# Patient Record
Sex: Female | Born: 1935 | Race: White | Hispanic: No | Smoking: Former smoker
Health system: Southern US, Community
[De-identification: ages and names within clinical notes are randomized; demographics above are authoritative.]

## PROBLEM LIST (undated history)

## (undated) DIAGNOSIS — G4700 Insomnia, unspecified: Secondary | ICD-10-CM

## (undated) DIAGNOSIS — M1990 Unspecified osteoarthritis, unspecified site: Secondary | ICD-10-CM

## (undated) DIAGNOSIS — W19XXXA Unspecified fall, initial encounter: Secondary | ICD-10-CM

## (undated) DIAGNOSIS — E785 Hyperlipidemia, unspecified: Secondary | ICD-10-CM

## (undated) DIAGNOSIS — R42 Dizziness and giddiness: Secondary | ICD-10-CM

## (undated) DIAGNOSIS — Z8619 Personal history of other infectious and parasitic diseases: Secondary | ICD-10-CM

## (undated) DIAGNOSIS — I6529 Occlusion and stenosis of unspecified carotid artery: Secondary | ICD-10-CM

## (undated) DIAGNOSIS — K219 Gastro-esophageal reflux disease without esophagitis: Secondary | ICD-10-CM

## (undated) DIAGNOSIS — N189 Chronic kidney disease, unspecified: Secondary | ICD-10-CM

## (undated) DIAGNOSIS — H409 Unspecified glaucoma: Secondary | ICD-10-CM

## (undated) DIAGNOSIS — E119 Type 2 diabetes mellitus without complications: Secondary | ICD-10-CM

## (undated) DIAGNOSIS — M5030 Other cervical disc degeneration, unspecified cervical region: Secondary | ICD-10-CM

## (undated) DIAGNOSIS — M4800 Spinal stenosis, site unspecified: Secondary | ICD-10-CM

## (undated) DIAGNOSIS — J302 Other seasonal allergic rhinitis: Secondary | ICD-10-CM

## (undated) DIAGNOSIS — K589 Irritable bowel syndrome without diarrhea: Secondary | ICD-10-CM

## (undated) DIAGNOSIS — N8110 Cystocele, unspecified: Secondary | ICD-10-CM

## (undated) DIAGNOSIS — E221 Hyperprolactinemia: Secondary | ICD-10-CM

## (undated) DIAGNOSIS — M419 Scoliosis, unspecified: Secondary | ICD-10-CM

## (undated) DIAGNOSIS — I10 Essential (primary) hypertension: Secondary | ICD-10-CM

## (undated) DIAGNOSIS — J449 Chronic obstructive pulmonary disease, unspecified: Secondary | ICD-10-CM

## (undated) DIAGNOSIS — R296 Repeated falls: Secondary | ICD-10-CM

## (undated) DIAGNOSIS — I679 Cerebrovascular disease, unspecified: Secondary | ICD-10-CM

## (undated) DIAGNOSIS — M8580 Other specified disorders of bone density and structure, unspecified site: Secondary | ICD-10-CM

## (undated) DIAGNOSIS — D649 Anemia, unspecified: Secondary | ICD-10-CM

## (undated) DIAGNOSIS — I340 Nonrheumatic mitral (valve) insufficiency: Secondary | ICD-10-CM

## (undated) HISTORY — PX: SHOULDER ARTHROTOMY: SHX1050

## (undated) HISTORY — DX: Dizziness and giddiness: R42

## (undated) HISTORY — DX: Insomnia, unspecified: G47.00

## (undated) HISTORY — DX: Unspecified glaucoma: H40.9

## (undated) HISTORY — DX: Other cervical disc degeneration, unspecified cervical region: M50.30

## (undated) HISTORY — PX: DILATION AND CURETTAGE OF UTERUS: SHX78

## (undated) HISTORY — DX: Unspecified fall, initial encounter: W19.XXXA

## (undated) HISTORY — DX: Type 2 diabetes mellitus without complications: E11.9

## (undated) HISTORY — DX: Unspecified osteoarthritis, unspecified site: M19.90

## (undated) HISTORY — DX: Irritable bowel syndrome, unspecified: K58.9

## (undated) HISTORY — DX: Scoliosis, unspecified: M41.9

## (undated) HISTORY — DX: Spinal stenosis, site unspecified: M48.00

## (undated) HISTORY — DX: Occlusion and stenosis of unspecified carotid artery: I65.29

## (undated) HISTORY — DX: Hyperprolactinemia: E22.1

## (undated) HISTORY — PX: EYE SURGERY: SHX253

## (undated) HISTORY — DX: Cerebrovascular disease, unspecified: I67.9

## (undated) HISTORY — DX: Cystocele, unspecified: N81.10

## (undated) HISTORY — DX: Anemia, unspecified: D64.9

## (undated) HISTORY — DX: Nonrheumatic mitral (valve) insufficiency: I34.0

## (undated) HISTORY — DX: Other seasonal allergic rhinitis: J30.2

## (undated) HISTORY — DX: Hyperlipidemia, unspecified: E78.5

## (undated) HISTORY — DX: Personal history of other infectious and parasitic diseases: Z86.19

## (undated) HISTORY — DX: Repeated falls: R29.6

## (undated) HISTORY — PX: COLONOSCOPY W/ POLYPECTOMY: SHX1380

## (undated) HISTORY — DX: Other specified disorders of bone density and structure, unspecified site: M85.80

## (undated) HISTORY — DX: Chronic obstructive pulmonary disease, unspecified: J44.9

## (undated) HISTORY — DX: Essential (primary) hypertension: I10

## (undated) HISTORY — DX: Chronic kidney disease, unspecified: N18.9

---

## 1986-11-25 HISTORY — PX: BREAST SURGERY: SHX581

## 1991-11-26 HISTORY — PX: PITUITARY SURGERY: SHX203

## 1998-06-06 ENCOUNTER — Ambulatory Visit (HOSPITAL_COMMUNITY): Admission: RE | Admit: 1998-06-06 | Discharge: 1998-06-06 | Payer: Self-pay | Admitting: Internal Medicine

## 1998-11-22 ENCOUNTER — Other Ambulatory Visit: Admission: RE | Admit: 1998-11-22 | Discharge: 1998-11-22 | Payer: Self-pay | Admitting: Internal Medicine

## 1999-08-28 ENCOUNTER — Ambulatory Visit (HOSPITAL_COMMUNITY): Admission: RE | Admit: 1999-08-28 | Discharge: 1999-08-28 | Payer: Self-pay | Admitting: Endocrinology

## 1999-08-28 ENCOUNTER — Encounter: Payer: Self-pay | Admitting: Endocrinology

## 2001-08-31 ENCOUNTER — Encounter: Payer: Self-pay | Admitting: Internal Medicine

## 2001-08-31 ENCOUNTER — Encounter: Admission: RE | Admit: 2001-08-31 | Discharge: 2001-08-31 | Payer: Self-pay | Admitting: Internal Medicine

## 2002-04-14 ENCOUNTER — Encounter (INDEPENDENT_AMBULATORY_CARE_PROVIDER_SITE_OTHER): Payer: Self-pay | Admitting: Specialist

## 2002-04-14 ENCOUNTER — Ambulatory Visit (HOSPITAL_COMMUNITY): Admission: RE | Admit: 2002-04-14 | Discharge: 2002-04-14 | Payer: Self-pay | Admitting: Gastroenterology

## 2003-01-06 ENCOUNTER — Other Ambulatory Visit: Admission: RE | Admit: 2003-01-06 | Discharge: 2003-01-06 | Payer: Self-pay | Admitting: Internal Medicine

## 2003-12-28 ENCOUNTER — Encounter: Admission: RE | Admit: 2003-12-28 | Discharge: 2003-12-28 | Payer: Self-pay

## 2004-07-25 ENCOUNTER — Ambulatory Visit: Admission: RE | Admit: 2004-07-25 | Discharge: 2004-07-25 | Payer: Self-pay | Admitting: Internal Medicine

## 2004-10-03 ENCOUNTER — Ambulatory Visit: Payer: Self-pay | Admitting: Oncology

## 2006-04-07 ENCOUNTER — Ambulatory Visit: Payer: Self-pay | Admitting: Pulmonary Disease

## 2006-04-25 ENCOUNTER — Ambulatory Visit: Payer: Self-pay | Admitting: Pulmonary Disease

## 2006-06-03 ENCOUNTER — Inpatient Hospital Stay (HOSPITAL_COMMUNITY): Admission: EM | Admit: 2006-06-03 | Discharge: 2006-06-06 | Payer: Self-pay | Admitting: Emergency Medicine

## 2006-06-04 ENCOUNTER — Encounter: Payer: Self-pay | Admitting: Cardiology

## 2006-06-04 ENCOUNTER — Ambulatory Visit: Payer: Self-pay | Admitting: Oncology

## 2006-06-04 ENCOUNTER — Ambulatory Visit: Payer: Self-pay | Admitting: Cardiology

## 2006-06-06 ENCOUNTER — Encounter (INDEPENDENT_AMBULATORY_CARE_PROVIDER_SITE_OTHER): Payer: Self-pay | Admitting: *Deleted

## 2006-06-11 ENCOUNTER — Ambulatory Visit (HOSPITAL_COMMUNITY): Admission: RE | Admit: 2006-06-11 | Discharge: 2006-06-11 | Payer: Self-pay | Admitting: Gastroenterology

## 2006-08-26 ENCOUNTER — Ambulatory Visit: Payer: Self-pay | Admitting: Oncology

## 2006-10-23 ENCOUNTER — Ambulatory Visit: Payer: Self-pay | Admitting: Oncology

## 2006-10-28 LAB — CBC WITH DIFFERENTIAL/PLATELET
HGB: 11.3 g/dL — ABNORMAL LOW (ref 11.6–15.9)
MCH: 31.6 pg (ref 26.0–34.0)
MONO%: 7.7 % (ref 0.0–13.0)
NEUT#: 4.2 10*3/uL (ref 1.5–6.5)
NEUT%: 68.9 % (ref 39.6–76.8)
RBC: 3.56 10*6/uL — ABNORMAL LOW (ref 3.70–5.32)
lymph#: 1.3 10*3/uL (ref 0.9–3.3)

## 2006-10-28 LAB — IRON AND TIBC
Iron: 174 ug/dL — ABNORMAL HIGH (ref 42–145)
TIBC: 353 ug/dL (ref 250–470)
UIBC: 179 ug/dL

## 2006-10-28 LAB — RETICULOCYTES
RETIC #: 52.3 10*3/uL (ref 19.7–115.1)
Retic %: 1.5 % (ref 0.4–2.3)

## 2007-12-09 ENCOUNTER — Encounter (HOSPITAL_COMMUNITY): Admission: RE | Admit: 2007-12-09 | Discharge: 2007-12-10 | Payer: Self-pay | Admitting: Gastroenterology

## 2010-05-23 ENCOUNTER — Encounter: Admission: RE | Admit: 2010-05-23 | Discharge: 2010-05-23 | Payer: Self-pay | Admitting: Endocrinology

## 2010-06-26 ENCOUNTER — Encounter: Admission: RE | Admit: 2010-06-26 | Discharge: 2010-06-26 | Payer: Self-pay | Admitting: Internal Medicine

## 2010-07-25 ENCOUNTER — Encounter: Admission: RE | Admit: 2010-07-25 | Discharge: 2010-07-25 | Payer: Self-pay | Admitting: Internal Medicine

## 2010-12-15 ENCOUNTER — Other Ambulatory Visit: Payer: Self-pay | Admitting: Nephrology

## 2010-12-15 DIAGNOSIS — N281 Cyst of kidney, acquired: Secondary | ICD-10-CM

## 2010-12-15 DIAGNOSIS — Z09 Encounter for follow-up examination after completed treatment for conditions other than malignant neoplasm: Secondary | ICD-10-CM

## 2011-01-07 ENCOUNTER — Ambulatory Visit
Admission: RE | Admit: 2011-01-07 | Discharge: 2011-01-07 | Disposition: A | Discharge status: Home / Self Care | Payer: Medicare Other | Source: Ambulatory Visit | Attending: Nephrology | Admitting: Nephrology

## 2011-01-07 DIAGNOSIS — N281 Cyst of kidney, acquired: Secondary | ICD-10-CM

## 2011-01-07 DIAGNOSIS — Z09 Encounter for follow-up examination after completed treatment for conditions other than malignant neoplasm: Secondary | ICD-10-CM

## 2011-03-26 ENCOUNTER — Other Ambulatory Visit: Payer: Self-pay | Admitting: Internal Medicine

## 2011-03-26 ENCOUNTER — Ambulatory Visit
Admission: RE | Admit: 2011-03-26 | Discharge: 2011-03-26 | Disposition: A | Discharge status: Home / Self Care | Payer: Medicare Other | Source: Ambulatory Visit | Attending: Internal Medicine | Admitting: Internal Medicine

## 2011-03-26 DIAGNOSIS — R1031 Right lower quadrant pain: Secondary | ICD-10-CM

## 2011-04-12 NOTE — Discharge Summary (Signed)
Shelia Holt, Shelia Holt              ACCOUNT NO.:  [id number]   MEDICAL RECORD NO.:  [id number]          PATIENT TYPE:  INP   LOCATION:  2901                         FACILITY:  [facility]   PHYSICIAN:  Mobolaji B. Bakare, M.D.DATE OF BIRTH:  1922/06/01   DATE OF ADMISSION:  06/03/2006  DATE OF DISCHARGE:  06/06/2006                                 DISCHARGE SUMMARY   PRIMARY CARE PHYSICIAN:  Dr. Merri Brunette.   FINAL DIAGNOSES:  1.  Severe iron deficiency anemia.  2.  Positive hemoccult, likely gastrointestinal for #1.  3.  Chest pain, cardiac work-up to follow as an outpatient.  4.  Mild congestive heart failure, likely diastolic.  5.  Mild chronic obstructive pulmonary disease with bronchitis.  6.  Diabetes mellitus.  7.  Hypokalemia, repleted.  8.  Multiple valvular disease.  9.  Left-sided diverticulosis.  10. Thrombocytopenia.   PROCEDURES:  1.  Chest x-ray done on the 10th of July 2007, showed no evidence of acute      cardiac or pulmonary process.  2.  Chest x-ray done on the 13th of July 2007, showed mild cardiomegaly,      vascular congestion, and bilateral effusion compatible with CHF.  3.  2 D echocardiogram done on the 11th of July 2007, shows ejection      fraction of 55% with no regional wall motion abnormality, mild to      moderate mitral valve regurgitation, moderate to severe tricuspid      valvular regurgitation.  There was good left ventricular function and      mild LV dysfunction.  There is significant pulmonary hypertension with      significant tricuspid regurgitation.  4.  Upper endoscopy done by Dr. Sherin Quarry, showed normal      esophagogastroduodenoscopy without any evidence of recent bleed of      bleeding site.  Small bowel biopsy was taken for sprue.  5.  Colonoscopy done on the 13th of July showed left-sided diverticulosis.      There were no bleeding sites.  Two more are poorly seen.   CONSULTS:  1.  Cardiologic consult, Dr. Jacinto Halim.  2.  Hematology  consult, Dr. Darnelle Catalan.  3.  GI consult, Dr. Sherin Quarry.   BRIEF HISTORY:  Please refer to the admission H&P dictated by Dr. Corky Downs.  In brief, Shelia Holt is a pleasant 77 year old Caucasian female, who  presented with dyspnea on exertion for 2-3 weeks.  She was found to be  severely anemic in the emergency room.  EKG was unremarkable, and cardiac  enzymes at the point of care were normal.  Cardiology was consulted given  the symptoms suggestive of unstable angina, but she could not have any  intervention because of the anemia.  She was admitted for anemia work-up and  transfusion.   HOSPITAL COURSE:  #1 - SEVERE ANEMIA DISEASE:  Secondary to iron deficiency  given the low ferritin and relatively high TIBC.  She had 2 positive  Hemoccults.  The patient received a total of 4 units of packed red blood  cells.  Hemoglobin has improved from 4.4 on admission  to 9.4 on discharge.  The patient will continue with iron supplement 2 times daily for 6 months.  She had recent colonoscopy in November 2007.  At that time, she had  polypectomy.  It was heard by Dr. Sherin Quarry that hematology consult should be  obtained to assess from hematological point.  Dr. Darnelle Catalan offered the  consult, and his recommendation is that this is most likely a GI source of  iron loss given the severity of the iron deficiency.  The patient's ferritin  is very low, 7.  Iron 18, TIBC 438.  She had microcytic anemia.  The  recommendation is to have GI evaluate the patient further, and she should  continue with iron supplement.  She would follow up with Dr. Darnelle Catalan in the  office in 3 months.  [name] went for upper endoscopy and colonoscopy.  Findings were unremarkable.  Result is as noted above.  She is scheduled to  have capsule endoscopy by Dr. Sherin Quarry in the outpatient setting.  She was  asymptomatic at the time of discharge.   #2 - CHEST PAIN:  She ruled out for myocardial infarction with 3 negative  sets of cardiac  enzymes.  EKG had subtle changes which were not felt to be  remarkable.  Nevertheless, she may have an underlying CAD that was  symptomatic secondary to the severe anemia.  She needed that cardiac work-  up.  This was limited by the uncertainty of GI findings and if she needs to  be on anticoagulation, Plavix, etc. Decision was made to treat  conservatively for now.  The patient was placed on beta blocker.  She is  scheduled to have a stress test on the 20th of July 2007 and to follow up  with Dr. Jacinto Halim on the 31st of July 2007.  She can comfortably resume  aspirin, as there was no significant further bleeding on upper endoscopy.  She was sent home on baby aspirin.  She is also on Protonix for gastric  protection.   #3 - MILD CHRONIC OBSTRUCTIVE PULMONARY DISEASE WITH BRONCHITIS:  This was  stable during the course of hospitalization; however, she developed cough  with whitish-yellow sputum.  She also had the low-grade fever of 100.3, but  there was no leukocytosis.  The patient was started on Avelox which she  would take for 5 days.  She will continue with Advair, Spiriva, and  bronchodilator as she was given before and Ventolin inhaler.  There is no  need for steroid at this time.   #4 - MILD CONGESTIVE HEART FAILURE:  This is likely secondary to multiple  blood transfusions and IV fluids that she received.  She did receive Lasix  in between all the units of transfusion.  Her BNP at the time of admission  was 1001 hundred, and she had a 2 D echocardiogram as described above.  Initial chest x-ray on admission was unremarkable for CHF.  She was  discharged home on low dose Lasix 20 mg x2 weeks, and she should be  reassessed in Dr. Carolee Rota office.  I have instructed her to check weights 3  times a week, as she is on salt restriction.   #5 - DIABETES MELLITUS:  Fairly controlled during this hospitalization.  #6 - THROMBOCYTOPENIA:  The patient had significant drop in platelets.  She   was not on heparin.  Etiology of this is unclear.  It may be related to  blood transfusion.  She should have a CBC checked in  1 week.   DISCHARGE CONDITION:  The patient is hemodynamically highly stable.  She had  a low-grade fever of 100 points on the morning of discharge.  Follow up  chest x-ray did not show any pneumonia.  She does have a phlebitis on her  right forearm which could potentially be the source of low-grade fever or  post transfusion fever.  She will apply cold compress to the phlebitis and,  in addition, she is being discharged home on Avelox for acute bronchitis.  I  have instructed her to come back to the emergency room should the fever get  worse.  The patient is insisting on going home tonight because she has some  family issues to take care of.   DISCHARGE MEDICATIONS:  1.  Avelox 400 mg daily x4 more days.  2.  Toprol XL 25 mg daily.  3.  Lasix 20 mg daily x2 weeks.  4.  Baby aspirin 1 daily.  5.  Metformin 500 mg daily.  6.  Benazepril/hydrochlorothiazide 10 mg daily.  7.  Advair 250/50, 1 b.i.d.  8.  Spiriva 80 mcg daily.  9.  Allegra 60 mg b.i.d.  10. Xalatan 0.5% 1 each eye daily.  11. Alphagan 0.15% each eye b.i.d. with Fresh Tears.  12. Metamucil 1 tab p.o. daily.  13. Sonata q.h.s.  14. Protonix 40 mg daily.  15. Iron tabs 1 b.i.d.  16. Centrum Silver 1 daily.  17. Tramadol 25 mg p.r.n.  18. Vagifem ring 25 mcg.  That is Estrace cream.   FOLLOW UP:  1.  With Dr. Renne Crigler early next week.  2.  Dr. Sherin Quarry next week for capsule endoscopy.  3.  Stress test on the 28th of July 2007 at 9 a.m.  4.  Follow up with Dr. Jacinto Halim on June 24, 2006.  5.  Dr. Darnelle Catalan in 3 months.  6.  Follow up small bowel biopsy result.  7.  CBC in 1 week.   DISCHARGE LABORATORY DATA:  Ferritin 7, iron 18, TIBC 438, vitamin B12 402,  sodium 137, potassium 3.4 (repleted), chloride 106, CO2 22, glucose 94, BUN  7, creatinine 0.8, calcium 8.0, white cell 6.9, hemoglobin 9.4,  hematocrit  29.8, MCV 80, platelets 142.      Mobolaji B. Corky Downs, M.D.  Electronically Signed     MBB/MEDQ  D:  06/06/2006  T:  06/07/2006  Job:  [id number]   cc:   Tasia Catchings, M.D.  Fax: [phone]   Soyla Murphy. Renne Crigler, M.D.  Fax: [phone]   Valentino Hue. Magrinat, M.D.  Fax: [phone]   Cristy Hilts. Jacinto Halim, MD  Fax: [phone]

## 2011-04-12 NOTE — Consult Note (Signed)
NAMEHINDY, PERRAULT NO.:  [id number]   MEDICAL RECORD NO.:  [id number]          PATIENT TYPE:  INP   LOCATION:  2901                         FACILITY:  [facility]   PHYSICIAN:  Valentino Hue. Magrinat, M.D.DATE OF BIRTH:  [birth year]   DATE OF CONSULTATION:  06/04/2006  DATE OF DISCHARGE:                                   CONSULTATION   REASON FOR CONSULTATION:  Severe iron-deficiency anemia.   SUBJECTIVE/HISTORY OF PRESENT ILLNESS:  Ms. Cabacungan is a delightful 77-year-  old [place] woman who is being seen as an inpatient consultation today by  Dr. Raymond Gurney C. Magrinat per request of the [facility] team for assessment of  severe iron-deficiency anemia.  Ms. Sivils was admitted through the  emergency room on June 03, 2006 with a hemoglobin of 4.4 gm with an MCV of  63 fl.  She had a normal vitamin B-12, total bilirubin and folic acid.  Reticulocyte count is currently pending.  She was transfused, and is  currently receiving her fourth units of packed red blood cells via a slow  transfusion.  She overall feels that she is doing much better.  Of note, she  was seen back in November of 2005 by Dr. Kimberlee Nearing of the medical  hematology/oncology service.  At that point, she had presented with a  hemoglobin of 11.4 with a hematocrit of 34.2%, platelet count of 291,000,  WBC of 6200 with an ANC of 2400.  Her MCV was 83.5 fl.  The differential was  essentially normal.  It was felt that she had a very mild anemia, and was  minimally asymptomatic.  She does have a history of Hemoccult positive  stools, including the most recent stool obtained here being heme positive.  She was seen by Dr. Sherin Quarry apparently in November or October of 2006.  A  colonoscopy at that revealed 3 polyps which were removed.  She had been  unaware of any frank hematochezia or melenic stools.  She does take 2 iron  tablets per day, but she is unsure of the exact brand.  Again, since  transfusions, she no  longer has angina.  She is having more energy.   PAST MEDICAL HISTORY:  1.  Diabetes mellitus.  2.  Hypertension.  3.  Spinal stenosis.  4.  Hyperlipidemia.  5.  Prior gastric polyps in 1993.  6.  Left carotid artery stenosis.  7.  Bilateral eye glaucoma.  8.  Right rotator cuff injury.  9.  A vaginal pessary for uterine prolapse.   PSYCHIATRIC DIAGNOSIS:  Right cataract extraction in 2006.   MEDICATIONS AT THE TIME OF ADMISSION:  1.  Metformin ER 500 mg p.o. daily.  2.  Hydrochlorothiazide unknown dose.  3.  One baby aspirin per day.  4.  Advair 250/50 one puff b.i.d.  5.  Spiriva 18 mcg daily.  6.  Allegra 60 mg p.o. b.i.d.  7.  Two iron tablets per day.  8.  Protonix 40 mg p.o. daily.  9.  Xalatan 0.5% one drop in both eyes daily.  10. Alphagan 0.15% one drop each eye daily.  11. Metamucil one tablespoon daily.  12. Centrum Silver with calcium one p.o. daily.  13. Tramadol p.r.n.  14. Sonata p.r.n.  15. Vagifem 25 mcg daily.  16. Estrace cream.   ALLERGIES:  CELEBREX and VIOXX cause heart palpitations and the patient  states liver enzyme elevations.   FAMILY MEDICAL HISTORY:  The patient's mother died at the age of 52 from  complications of congestive heart failure.  She was to start treatment again  for recurrent breast carcinoma.  Her father died of complications from a  stroke.  She had a brother who died of primary liver cancer at the age of  79, a sister who died unexpectedly at the age of 99, but had survived renal  carcinoma.  She has a sister who died after undergoing renal transplantation  after developing bone cancer.  She has an 69 year old sister who is alive  with arthritis, but otherwise fairly healthy.  She has a daughter, age 13  (her name is Dub Amis) who lives right next door to Ms. Fredric Mare.  Ms.  Faxon also has 3 grandchildren, 2 granddaughters, ages 44 and 30, and a  grandson, age 64.   SOCIAL HISTORY:  The patient does live independently in  [place], [place], where she has lived all of her life.  She is a widow.  Her husband  died of metastatic sarcoma in 11-20-87.  Again, her daughter lives  right next door.  She smoked about a half to one pack of cigarettes per day  for 38 years.  She quit 5 years ago.  She does not use alcohol products.  She is independent and drives.  The patient does admit she has not driven  over the past couple of weeks because she was feeling poorly.   REVIEW OF SYSTEMS:  As per HPI; otherwise, really noncontributory, including  no fevers, chills or night sweats, unexplained nausea or emesis.  She does  have chronic constipation and darkened stools, which she attributes more to  the iron.   OBJECTIVE/PHYSICAL EXAMINATION:  VITAL SIGNS:  The vital signs at the time  of consultation revealed blood pressure of 105/46, pulse 68, respirations  19, temperature 99.3, weight 139 pounds.  HEENT:  Conjunctivae pink.  Sclerae anicteric.  Oropharynx is benign.  LUNGS:  Actually pretty clear to auscultation bilaterally.  HEART:  Regular rate and rhythm.  ABDOMEN:  Soft with normal bowel sounds definitely appreciated.  Full exam  is deferred, though there is no pedal edema noted on extremities.   LABORATORY DATA:  As per HPI.   IMPRESSION/RECOMMENDATIONS:  Severe iron-deficiency anemia in a patient with  a history of gastrointestinal bleed, currently guaiac positive.  Dr.  Darnelle Catalan has discussed IV iron versus p.o. iron replacement with this  patient.  She really desires to proceed with iron given orally, and he feels  that this should be adequate.  We recommend iron sulfate one p.o. b.i.d.  A  GI work-up for source of bleed is underway; as to whether she will be  undergoing colonoscopy as an inpatient or outpatient is still to be  determined.  From our standpoint, we would recommend seeing the patient in 3 months to compare her hemoglobin, and to conclude a further work-up for  blood   abnormalities that may be necessary at that time.  At this point, a  peripheral smear is pending for review.  This has been discussed with the  patient, and she agrees with this plan.  Thank you very much for the kind consultation.     ______________________________  Donata Duff, P.A.      Valentino Hue. Magrinat, M.D.  Electronically Signed    CC/MEDQ  D:  06/04/2006  T:  06/04/2006  Job:  [id number]   cc:   Soyla Murphy. Renne Crigler, M.D.  Fax: [phone]   Jaclyn Prime. Lucas Mallow, M.D.  Fax: [phone]

## 2011-04-12 NOTE — Op Note (Signed)
NAMEVIVIA, ROSENBURG NO.:  [id number]   MEDICAL RECORD NO.:  [id number]           PATIENT TYPE:   LOCATION:                                 FACILITY:   PHYSICIAN:  Tasia Catchings, M.D.        DATE OF BIRTH:   DATE OF PROCEDURE:  06/11/2006  DATE OF DISCHARGE:                               OPERATIVE REPORT   GASTROENTEROLOGIST:  Tasia Catchings, M.D.   PROCEDURE:  Capsule endoscopy.   NOTE:  This is a redictation.  The patient had this procedure done, and  I read it in a timely fashion.  But the original dictation, which was on  the machine that I used to read these somehow got lost, and as a result  of the time delay, it is no longer available.   INDICATIONS FOR PROCEDURE:  Guaiac positive stool and anemia, with  negative endoscopy and colonoscopy.  The purpose was to look for a small  bowel lesion.   FINDINGS:  The capsule was successfully swallowed and went through the  entire small bowel.  I cannot tell you the specific pictures that  represented the duodenum, jejunum and ileum, but I know that I spent  about 45 minutes studying all the pictures, and there was absolutely no  evidence of AV malformations, polyps, tumors, ulcers or other potential  bleeding sites.   FINAL IMPRESSION:  Negative capsule endoscopy.      Tasia Catchings, M.D.  Electronically Signed     JW/MEDQ  D:  02/25/2007  T:  02/25/2007  Job:  [id number]

## 2011-04-12 NOTE — H&P (Signed)
NAMECLYDIE, Shelia Holt              ACCOUNT NO.:  [id number]   MEDICAL RECORD NO.:  [id number]          PATIENT TYPE:  INP   LOCATION:  1843                         FACILITY:  [facility]   PHYSICIAN:  Mobolaji B. Bakare, M.D.DATE OF BIRTH:  [birth year]   DATE OF ADMISSION:  06/03/2006  DATE OF DISCHARGE:                                HISTORY & PHYSICAL   CHIEF COMPLAINT:  Dyspnea on exertion for 2-3 weeks.   HISTORY OF PRESENTING COMPLAINT:  Shelia Holt is a pleasant 77 year old  Caucasian female who has been in her usual state of health until about 2-3  weeks ago when she started noticing dyspnea on exertion.  She could hardly  walk to the bathroom without getting short of breath.  This was also  accompanied by brief episodes of chest pain but no diaphoresis.  This has  increasingly gotten worse in the past couple of days.  She was seen by Dr.  Renne Crigler about a week ago and she had some blood tests.  She reported that she  went to review these blood tests today and, in addition, mentioned the chest  pian.  She had an EKG done and she was referred to the emergency room.  On  initial evaluation in the emergency room, she was thought to have some  subtle EKG changes, hence, cardiology was consulted.  However, the  hemoglobin came back reported as 4.4 and hematocrit of 15.2.  It was  repeated and the reading was hemoglobin 4.4 and hematocrit 15.1.   Shelia Holt has noted dark stool in the past three weeks, however, she has  attributed these to the iron pills she has been using.  She was evaluated by  Dr. Sherin Quarry in October/November 2006 for anemia.  She had a colonoscopy  then and had three polyps removed.  She denies epigastric pain, no nausea,  no hematemesis.  She has not lost weight.   There is no orthopnea, PND, although she reported lower extremity pedal  swelling which has been on and off and she attributed these to some anti-  hypertensive medications she has used in the past.   The  patient uses baby aspirin.  She denies over-the-counter NSAIDs.  She  uses Tramadol for pain (spinal stenosis).   REVIEW OF SYSTEMS:  She denies cough.  No headaches.  She gets palpitations  accompanying the chest pain most recently since she has developed dyspnea on  exertion.  There has been no fever, no loss of appetite.  No vomiting,  nausea, diarrhea.  She gets constipation for which she uses Metamucil.  No  change in her vision.  There has been no weight loss.   PAST MEDICAL HISTORY:  Significant for:  1.  Diabetes mellitus.  2.  Hypertension.  3.  Spinal stenosis for which she sees Dr. Ethelene Hal and she obtains injections,      this has been pretty stable.  4.  Hyperlipidemia.  5.  Gastric polyp in 1993.  6.  Rotator cuff injury.  7.  Left carotid artery stenosis.  She has been followed by Dr. Hart Rochester.  No  critical stenosis.  8.  She has had exercise stress test in 1999 and 2005.  According to      patient, these were negative.  9.  Glaucoma.  10. Vaginal pessary.   PAST SURGICAL HISTORY:  Right cataract extraction in 2006.   CURRENT MEDICATIONS:  1.  Metformin ER 500 mg daily.  2.  Benazepril hydrochlorothiazide unknown dose.  3.  Aspirin 81 mg daily.  4.  Advair 250/50 one puff b.i.d.  5.  Spiriva 18 mcg daily.  6.  Allegra 60 mg two times a day.  7.  Xalatan 0.5%, one daily both eyes.  8.  Alphagan 0.15%, one daily.  9.  Metamucil one tablespoon daily.  10. Iron tablets.  11. Centrum Silver with calcium.  12. Tramadol p.r.n.  13. Sonata p.r.n.  14. Vagifem 25 mcg daily/Estrace cream.  15. Protonix 40 mg daily.   ALLERGIES:  1.  CELEBREX CAUSES PALPITATIONS.  2.  VIOXX CAUSES PALPITATIONS.   SOCIAL HISTORY:  The patient does not drink alcohol.  She has quit smoking.  She lives by herself but her daughter lives next door.  She is fairly  independent and she does not use a walker or a cane but somewhat limited in  activities by chronic back pain and spinal  stenosis.   FAMILY HISTORY:  She has one daughter.  Mother passed away from heart  attack.  Father died of a stroke.  One sister had kidney problems and had  kidney transplant.  Mother had breast cancer and diabetes mellitus.   PHYSICAL EXAMINATION:  VITAL SIGNS:  Temperature 99, blood pressure 142/57, pulse 91, respiratory  rate 20, O2 saturation 98% on 3 liters.  GENERAL:  The patient is not in respiratory distress.  HEAD AND NECK:  Normocephalic, atraumatic head.  She is not jaundiced.  She  looks pale.  No elevated JVD.  There is left carotid bruit.  No thyromegaly.  Mucous membranes moist.  No oral thrush.  LUNGS:  A few bibasilar rales, no wheeze, no rhonchi.  CVS:  S1 and S2 regular, no murmur or gallops.  ABDOMEN:  Soft, nontender, nondistended, no hepatosplenomegaly, bowel sounds  present.  RECTAL:  She has some anal tags.  There is no mass felt in the rectum.  Stool is dark brown and it is heme positive.  EXTREMITIES:  No pedal edema, no cyanosis.  Dorsalis pedes pulse is 2+  bilaterally.  She has some telangiectasia on lower extremities.  CNS:  No focal neurological deficit.  SKIN:  No rash, no petechiae.   INITIAL LABORATORY DATA:  Magnesium 2.3.  PT 14.9, INR 1.2, PTT 28.  White  count 6.2, hemoglobin 4.4, hematocrit 15.2, MCV 62.6, platelets 224.  Sodium  137, potassium 3.9, chloride 195, CO2 21, glucose 139, BUN 20, creatinine  1.2, bilirubin 0.4, alkaline phos 187, AST 38, ALT 18, total protein 6.7,  albumin 4.1, calcium 8.8.  CK MB 1.9, creatinine kinase 90, troponin 0.02.  Repeat hemoglobin and hematocrit 4.4 and 15.1.  Repeat cardiac markers were  also normal.  Creatinine 1.2.  EKG done in Dr. Carolee Rota office shows ST  depression in V4, V5, and flattened in V6.  It was normal sinus rhythm with  a heart rate of 98.  Repeat EKG in the emergency room showed normal sinus rhythm with occasional PVCs and slight ST depression in V4 and V5.   RADIOLOGICAL DATA:  Chest  x-ray showed no acute cardiopulmonary disease.   ASSESSMENT/PLAN:  Shelia Holt  is an 77 year old Caucasian female presenting  with dyspnea on exertion, chest pain, severe anemia, and positive hemoccult.  She does have a history of colon polyp and gastric polyp.   1.  GI bleed.  Likely chronic upper GI loss.  I will hold aspirin.  Start      Protonix 40 mg IV daily.  She will be placed on clear liquids and I have      requested a GI consult by Dr. Sherin Quarry.  She will be admitted to the      step down unit.  2.  Severe anemia.  We will transfuse slowly.  I will give 2 units packed      red blood cells today and repeat H&H after transfusion.  Will obtain      anemia panel prior to blood transfusion.  3.  Chest pain/dyspnea on exertion.  This is more than likely secondary to      the severe anemia, however, the patient may have an underlying coronary      artery disease.  The GI blood loss precludes active intervention at this      point.  The patient has been seen by cardiology (Dr. Jacinto Halim).  A      recommendation has been made to do serial cardiac enzymes and start beta      blocker.  She cannot have aspirin or heparin at this point.  4.  Spinal stenosis.  Will continue with Tramadol p.r.n.  5.  Marked COPD.  Continue Spiriva and Advair.  6.  Diabetes mellitus.  The patient is going to be on clear liquids.  Will      hold Metformin for now and place on sliding scale insulin with NovoLog      using sensitive protocol.      Mobolaji B. Corky Downs, M.D.  Electronically Signed     MBB/MEDQ  D:  06/03/2006  T:  06/03/2006  Job:  [id number]   cc:   Soyla Murphy. Renne Crigler, M.D.  Fax: [phone]   Jaclyn Prime. Lucas Mallow, M.D.  Fax: [phone]   Tasia Catchings, M.D.  Fax: [phone]

## 2011-07-17 ENCOUNTER — Encounter (INDEPENDENT_AMBULATORY_CARE_PROVIDER_SITE_OTHER): Payer: Self-pay | Admitting: General Surgery

## 2011-08-15 LAB — CROSSMATCH
ABO/RH(D): A POS
Antibody Screen: NEGATIVE

## 2011-08-19 ENCOUNTER — Encounter (INDEPENDENT_AMBULATORY_CARE_PROVIDER_SITE_OTHER): Payer: Medicare Other | Admitting: General Surgery

## 2011-12-06 DIAGNOSIS — N289 Disorder of kidney and ureter, unspecified: Secondary | ICD-10-CM | POA: Diagnosis not present

## 2011-12-06 DIAGNOSIS — R079 Chest pain, unspecified: Secondary | ICD-10-CM | POA: Diagnosis not present

## 2012-01-23 DIAGNOSIS — M79609 Pain in unspecified limb: Secondary | ICD-10-CM | POA: Diagnosis not present

## 2012-02-07 DIAGNOSIS — Z1231 Encounter for screening mammogram for malignant neoplasm of breast: Secondary | ICD-10-CM | POA: Diagnosis not present

## 2012-02-17 DIAGNOSIS — M79609 Pain in unspecified limb: Secondary | ICD-10-CM | POA: Diagnosis not present

## 2012-03-19 DIAGNOSIS — M79609 Pain in unspecified limb: Secondary | ICD-10-CM | POA: Diagnosis not present

## 2012-03-19 DIAGNOSIS — N289 Disorder of kidney and ureter, unspecified: Secondary | ICD-10-CM | POA: Diagnosis not present

## 2012-03-19 DIAGNOSIS — I6529 Occlusion and stenosis of unspecified carotid artery: Secondary | ICD-10-CM | POA: Diagnosis not present

## 2012-03-19 DIAGNOSIS — R55 Syncope and collapse: Secondary | ICD-10-CM | POA: Diagnosis not present

## 2012-04-10 DIAGNOSIS — E559 Vitamin D deficiency, unspecified: Secondary | ICD-10-CM | POA: Diagnosis not present

## 2012-04-14 DIAGNOSIS — M79609 Pain in unspecified limb: Secondary | ICD-10-CM | POA: Diagnosis not present

## 2012-04-15 DIAGNOSIS — N2581 Secondary hyperparathyroidism of renal origin: Secondary | ICD-10-CM | POA: Diagnosis not present

## 2012-04-15 DIAGNOSIS — N184 Chronic kidney disease, stage 4 (severe): Secondary | ICD-10-CM | POA: Diagnosis not present

## 2012-04-15 DIAGNOSIS — R5383 Other fatigue: Secondary | ICD-10-CM | POA: Diagnosis not present

## 2012-04-15 DIAGNOSIS — I129 Hypertensive chronic kidney disease with stage 1 through stage 4 chronic kidney disease, or unspecified chronic kidney disease: Secondary | ICD-10-CM | POA: Diagnosis not present

## 2012-04-15 DIAGNOSIS — R5381 Other malaise: Secondary | ICD-10-CM | POA: Diagnosis not present

## 2012-04-24 DIAGNOSIS — H4010X0 Unspecified open-angle glaucoma, stage unspecified: Secondary | ICD-10-CM | POA: Diagnosis not present

## 2012-04-24 DIAGNOSIS — H409 Unspecified glaucoma: Secondary | ICD-10-CM | POA: Diagnosis not present

## 2012-07-01 DIAGNOSIS — E229 Hyperfunction of pituitary gland, unspecified: Secondary | ICD-10-CM | POA: Diagnosis not present

## 2012-07-01 DIAGNOSIS — D509 Iron deficiency anemia, unspecified: Secondary | ICD-10-CM | POA: Diagnosis not present

## 2012-07-01 DIAGNOSIS — M899 Disorder of bone, unspecified: Secondary | ICD-10-CM | POA: Diagnosis not present

## 2012-07-01 DIAGNOSIS — Z0000 Encounter for general adult medical examination without abnormal findings: Secondary | ICD-10-CM | POA: Diagnosis not present

## 2012-07-01 DIAGNOSIS — E119 Type 2 diabetes mellitus without complications: Secondary | ICD-10-CM | POA: Diagnosis not present

## 2012-07-01 DIAGNOSIS — E785 Hyperlipidemia, unspecified: Secondary | ICD-10-CM | POA: Diagnosis not present

## 2012-07-06 DIAGNOSIS — L570 Actinic keratosis: Secondary | ICD-10-CM | POA: Diagnosis not present

## 2012-07-06 DIAGNOSIS — I779 Disorder of arteries and arterioles, unspecified: Secondary | ICD-10-CM | POA: Diagnosis not present

## 2012-07-06 DIAGNOSIS — Z0000 Encounter for general adult medical examination without abnormal findings: Secondary | ICD-10-CM | POA: Diagnosis not present

## 2012-07-06 DIAGNOSIS — R42 Dizziness and giddiness: Secondary | ICD-10-CM | POA: Diagnosis not present

## 2012-07-06 DIAGNOSIS — E782 Mixed hyperlipidemia: Secondary | ICD-10-CM | POA: Diagnosis not present

## 2012-07-06 DIAGNOSIS — Z1212 Encounter for screening for malignant neoplasm of rectum: Secondary | ICD-10-CM | POA: Diagnosis not present

## 2012-07-29 DIAGNOSIS — I129 Hypertensive chronic kidney disease with stage 1 through stage 4 chronic kidney disease, or unspecified chronic kidney disease: Secondary | ICD-10-CM | POA: Diagnosis not present

## 2012-07-29 DIAGNOSIS — N2581 Secondary hyperparathyroidism of renal origin: Secondary | ICD-10-CM | POA: Diagnosis not present

## 2012-07-29 DIAGNOSIS — R5383 Other fatigue: Secondary | ICD-10-CM | POA: Diagnosis not present

## 2012-09-23 DIAGNOSIS — I6529 Occlusion and stenosis of unspecified carotid artery: Secondary | ICD-10-CM | POA: Diagnosis not present

## 2012-09-23 HISTORY — DX: Occlusion and stenosis of unspecified carotid artery: I65.29

## 2012-09-25 DIAGNOSIS — I6529 Occlusion and stenosis of unspecified carotid artery: Secondary | ICD-10-CM | POA: Diagnosis not present

## 2012-09-25 DIAGNOSIS — E119 Type 2 diabetes mellitus without complications: Secondary | ICD-10-CM | POA: Diagnosis not present

## 2012-10-27 DIAGNOSIS — H4010X0 Unspecified open-angle glaucoma, stage unspecified: Secondary | ICD-10-CM | POA: Diagnosis not present

## 2012-10-27 DIAGNOSIS — H409 Unspecified glaucoma: Secondary | ICD-10-CM | POA: Diagnosis not present

## 2012-10-27 DIAGNOSIS — R51 Headache: Secondary | ICD-10-CM | POA: Diagnosis not present

## 2012-10-27 DIAGNOSIS — R6884 Jaw pain: Secondary | ICD-10-CM | POA: Diagnosis not present

## 2012-10-30 DIAGNOSIS — D485 Neoplasm of uncertain behavior of skin: Secondary | ICD-10-CM | POA: Diagnosis not present

## 2012-10-30 DIAGNOSIS — I6529 Occlusion and stenosis of unspecified carotid artery: Secondary | ICD-10-CM | POA: Diagnosis not present

## 2012-10-30 DIAGNOSIS — E119 Type 2 diabetes mellitus without complications: Secondary | ICD-10-CM | POA: Diagnosis not present

## 2012-10-30 DIAGNOSIS — R51 Headache: Secondary | ICD-10-CM | POA: Diagnosis not present

## 2012-11-13 DIAGNOSIS — C44721 Squamous cell carcinoma of skin of unspecified lower limb, including hip: Secondary | ICD-10-CM | POA: Diagnosis not present

## 2012-11-13 DIAGNOSIS — Z85828 Personal history of other malignant neoplasm of skin: Secondary | ICD-10-CM | POA: Diagnosis not present

## 2012-11-13 DIAGNOSIS — L821 Other seborrheic keratosis: Secondary | ICD-10-CM | POA: Diagnosis not present

## 2012-12-02 ENCOUNTER — Encounter (INDEPENDENT_AMBULATORY_CARE_PROVIDER_SITE_OTHER): Payer: Self-pay | Admitting: General Surgery

## 2012-12-02 ENCOUNTER — Ambulatory Visit (INDEPENDENT_AMBULATORY_CARE_PROVIDER_SITE_OTHER): Payer: Medicare Other | Admitting: General Surgery

## 2012-12-02 VITALS — BP 122/58 | HR 60 | Temp 97.9°F | Resp 16 | Ht 63.0 in | Wt 133.8 lb

## 2012-12-02 DIAGNOSIS — C4490 Unspecified malignant neoplasm of skin, unspecified: Secondary | ICD-10-CM

## 2012-12-02 NOTE — Patient Instructions (Signed)
Will refer to Dr. Kelly Splinter

## 2012-12-03 ENCOUNTER — Encounter (INDEPENDENT_AMBULATORY_CARE_PROVIDER_SITE_OTHER): Payer: Self-pay | Admitting: General Surgery

## 2012-12-03 NOTE — Progress Notes (Signed)
**Note Shelia-Identified via Obfuscation** Subjective:     Patient ID: Shelia Holt, female   DOB: 11-05-1922, 79 y.o.   MRN: [id number]  HPI We are asked to see the patient in consultation by Dr. Margo Aye to evaluate her for a skin cancer on her right calf. The patient is an 79 year old white female who recently went to her dermatologist. At that time a lesion was noted on her right calf that appeared abnormal. This was biopsied with an incisional biopsy. The pathology came back squamous cell cancer. She denies any pain in the area. She did not recall any bleeding or ulceration from the area.  Review of Systems  Constitutional: Negative.   HENT: Negative.   Eyes: Negative.   Respiratory: Negative.   Cardiovascular: Negative.   Gastrointestinal: Negative.   Genitourinary: Negative.   Musculoskeletal: Negative.   Skin: Negative.   Neurological: Negative.   Hematological: Negative.   Psychiatric/Behavioral: Negative.        Objective:   Physical Exam  Constitutional: She is oriented to person, place, and time. She appears well-developed and well-nourished.  HENT:  Head: Normocephalic and atraumatic.  Eyes: Conjunctivae normal and EOM are normal. Pupils are equal, round, and reactive to light.  Neck: Normal range of motion. Neck supple.  Cardiovascular: Normal rate, regular rhythm and normal heart sounds.   Pulmonary/Chest: Effort normal and breath sounds normal.  Abdominal: Soft. Bowel sounds are normal.  Musculoskeletal: Normal range of motion.       The patient has an open wound on the back of her right calf from the incisional biopsy. The entire abnormal skin lesion is about the size of a silver dollar. There may be a small satellite lesion associated with it.  Neurological: She is alert and oriented to person, place, and time.  Skin: Skin is warm and dry.  Psychiatric: She has a normal mood and affect. Her behavior is normal.       Assessment:     The patient appears to have a large squamous cell skin cancer on the  back of her right calf. Because of the size of the lesion I think it is unlikely to be able to be closed primarily. Because of this I think it will require a skin graft possibly.    Plan:     I will refer her to Dr. Kelly Splinter in plastic surgery because of the probable need for skin grafting

## 2012-12-04 DIAGNOSIS — IMO0002 Reserved for concepts with insufficient information to code with codable children: Secondary | ICD-10-CM | POA: Insufficient documentation

## 2012-12-04 DIAGNOSIS — C801 Malignant (primary) neoplasm, unspecified: Secondary | ICD-10-CM | POA: Diagnosis not present

## 2012-12-11 DIAGNOSIS — I10 Essential (primary) hypertension: Secondary | ICD-10-CM | POA: Diagnosis not present

## 2012-12-14 DIAGNOSIS — I10 Essential (primary) hypertension: Secondary | ICD-10-CM | POA: Diagnosis not present

## 2012-12-22 DIAGNOSIS — N2581 Secondary hyperparathyroidism of renal origin: Secondary | ICD-10-CM | POA: Diagnosis not present

## 2012-12-23 ENCOUNTER — Other Ambulatory Visit: Payer: Self-pay | Admitting: Plastic Surgery

## 2012-12-23 DIAGNOSIS — H409 Unspecified glaucoma: Secondary | ICD-10-CM | POA: Diagnosis not present

## 2012-12-23 DIAGNOSIS — IMO0002 Reserved for concepts with insufficient information to code with codable children: Secondary | ICD-10-CM

## 2012-12-23 DIAGNOSIS — H401133 Primary open-angle glaucoma, bilateral, severe stage: Secondary | ICD-10-CM | POA: Insufficient documentation

## 2012-12-23 DIAGNOSIS — Z961 Presence of intraocular lens: Secondary | ICD-10-CM | POA: Diagnosis not present

## 2012-12-23 DIAGNOSIS — H4011X0 Primary open-angle glaucoma, stage unspecified: Secondary | ICD-10-CM | POA: Diagnosis not present

## 2012-12-23 NOTE — H&P (Signed)
    This document contains confidential information from a [facility] medical record system and may be unauthenticated. Release may be made only with a valid authorization or in accordance with applicable policies of Medical Center or its affiliates. This document must be maintained in a secure manner or discarded/destroyed as required by Medical Center policy or by a confidential means such as shredding.   CLAUDETTE WERMUTH   12/22/2012 1:00 PM Office Visit  MRN: [id number]  Department:  Plastic Surgery  Dept Phone: [phone]  Description: Female DOB: [birth year]  Provider: Wayland Denis, DO    Diagnoses  -  Squamous cell carcinoma   - Primary    199.1     Reason for Visit  -  Skin Problem     Vitals - Last Recorded     148/67  73  1.575 m (5' 2.01")  60.952 kg (134 lb 6 oz)  24.57 kg/m2       Subjective:    Patient ID: Shelia Holt is a 79 y.o. female.  HPI The patient is an 79 years old wf here for a history and physical for excision of a right posterior leg squamous cell carcinoma. She noticed the area getting larger, darker and raised over the past year. A biopsy done by dermatology showed a squamous cell carcinoma. The patient was referred from the general surgeons office. She has wide spread sun damage on all areas. The area on the posterior right lateral leg distal to the knee is 4 mm, raised with a dry skin appearance, slight blue discoloration at the edges. Ulcer in the center from the biopsied area. No sign of infection or similar lesion noted.   She has multiple medical conditions but is not on any blood thinners.  The following portions of the patient's history were reviewed and updated as appropriate: allergies, current medications, past family history, past medical history, past social history, past surgical history and problem list.  Review of Systems  Constitutional: Negative.   HENT: Negative.   Eyes: Negative.   Respiratory: Negative.     Cardiovascular: Negative.   Gastrointestinal: Negative.   Genitourinary: Negative.   Musculoskeletal: Negative.   Psychiatric/Behavioral: Negative.    Objective:   Physical Exam  Constitutional: She appears well-developed and well-nourished.  HENT:   Head: Normocephalic and atraumatic.  Eyes: Conjunctivae normal are normal. Pupils are equal, round, and reactive to light.  Cardiovascular: Normal rate.   Pulmonary/Chest: Effort normal.  Musculoskeletal: Normal range of motion.  Neurological: She is alert.  Skin: Skin is warm.  Psychiatric: She has a normal mood and affect. Her behavior is normal. Thought content normal.     Assessment:   1.  Squamous cell carcinoma        Plan:     Excision of right posterior leg skin cancer. May need to do a skin graft if the area is too large for primary closure. Risks and complications reviewed and consent signed.     Medications Ordered This Encounter     cephalexin (KEFLEX) 500 MG capsule 28  Take 1 capsule (500 mg total) by mouth 4 times daily.    HYDROcodone-acetaminophen (NORCO) 5-325 mg per tablet Take 1 tablet by mouth every 6 (six) hours as needed for 10 days for Pain.

## 2012-12-24 ENCOUNTER — Encounter (HOSPITAL_BASED_OUTPATIENT_CLINIC_OR_DEPARTMENT_OTHER): Payer: Self-pay | Admitting: *Deleted

## 2012-12-24 NOTE — Progress Notes (Signed)
Pt sdaughter to bring-saw dr Renne Crigler 12/14/12-labs done, saw dr croitoru 11/13-ekg done

## 2012-12-25 ENCOUNTER — Encounter (INDEPENDENT_AMBULATORY_CARE_PROVIDER_SITE_OTHER): Payer: Self-pay

## 2012-12-29 DIAGNOSIS — I129 Hypertensive chronic kidney disease with stage 1 through stage 4 chronic kidney disease, or unspecified chronic kidney disease: Secondary | ICD-10-CM | POA: Diagnosis not present

## 2012-12-29 DIAGNOSIS — N2581 Secondary hyperparathyroidism of renal origin: Secondary | ICD-10-CM | POA: Diagnosis not present

## 2012-12-29 DIAGNOSIS — D649 Anemia, unspecified: Secondary | ICD-10-CM | POA: Diagnosis not present

## 2012-12-30 ENCOUNTER — Encounter (HOSPITAL_BASED_OUTPATIENT_CLINIC_OR_DEPARTMENT_OTHER): Payer: Self-pay | Admitting: Anesthesiology

## 2012-12-30 ENCOUNTER — Ambulatory Visit (HOSPITAL_BASED_OUTPATIENT_CLINIC_OR_DEPARTMENT_OTHER)
Admission: RE | Admit: 2012-12-30 | Discharge: 2012-12-30 | Disposition: A | Discharge status: Home / Self Care | Payer: Medicare Other | Source: Ambulatory Visit | Attending: Plastic Surgery | Admitting: Plastic Surgery

## 2012-12-30 ENCOUNTER — Encounter (HOSPITAL_BASED_OUTPATIENT_CLINIC_OR_DEPARTMENT_OTHER)
Admission: RE | Disposition: A | Discharge status: Home / Self Care | Payer: Self-pay | Source: Ambulatory Visit | Attending: Plastic Surgery

## 2012-12-30 ENCOUNTER — Encounter (HOSPITAL_BASED_OUTPATIENT_CLINIC_OR_DEPARTMENT_OTHER): Payer: Self-pay | Admitting: Plastic Surgery

## 2012-12-30 ENCOUNTER — Ambulatory Visit (HOSPITAL_BASED_OUTPATIENT_CLINIC_OR_DEPARTMENT_OTHER): Payer: Medicare Other | Admitting: Anesthesiology

## 2012-12-30 DIAGNOSIS — E119 Type 2 diabetes mellitus without complications: Secondary | ICD-10-CM | POA: Diagnosis not present

## 2012-12-30 DIAGNOSIS — J449 Chronic obstructive pulmonary disease, unspecified: Secondary | ICD-10-CM | POA: Insufficient documentation

## 2012-12-30 DIAGNOSIS — C44721 Squamous cell carcinoma of skin of unspecified lower limb, including hip: Secondary | ICD-10-CM | POA: Diagnosis not present

## 2012-12-30 DIAGNOSIS — IMO0002 Reserved for concepts with insufficient information to code with codable children: Secondary | ICD-10-CM

## 2012-12-30 DIAGNOSIS — J4489 Other specified chronic obstructive pulmonary disease: Secondary | ICD-10-CM | POA: Insufficient documentation

## 2012-12-30 DIAGNOSIS — I10 Essential (primary) hypertension: Secondary | ICD-10-CM | POA: Diagnosis not present

## 2012-12-30 DIAGNOSIS — L905 Scar conditions and fibrosis of skin: Secondary | ICD-10-CM | POA: Diagnosis not present

## 2012-12-30 DIAGNOSIS — D2370 Other benign neoplasm of skin of unspecified lower limb, including hip: Secondary | ICD-10-CM | POA: Diagnosis not present

## 2012-12-30 HISTORY — PX: MASS EXCISION: SHX2000

## 2012-12-30 HISTORY — DX: Gastro-esophageal reflux disease without esophagitis: K21.9

## 2012-12-30 LAB — POCT HEMOGLOBIN-HEMACUE: Hemoglobin: 13.1 g/dL (ref 12.0–15.0)

## 2012-12-30 LAB — GLUCOSE, CAPILLARY: Glucose-Capillary: 139 mg/dL — ABNORMAL HIGH (ref 70–99)

## 2012-12-30 SURGERY — EXCISION MASS
Anesthesia: General | Site: Leg Lower | Laterality: Right | Wound class: Clean

## 2012-12-30 MED ORDER — FENTANYL CITRATE 0.05 MG/ML IJ SOLN: 50.0000 ug | INTRAMUSCULAR | Status: DC | PRN

## 2012-12-30 MED ORDER — CEFAZOLIN SODIUM-DEXTROSE 2-3 GM-% IV SOLR: 2.0000 g | INTRAVENOUS | Status: DC

## 2012-12-30 MED ORDER — ONDANSETRON HCL 4 MG/2ML IJ SOLN
INTRAMUSCULAR | Status: DC | PRN
  Administered 2012-12-30: 4 mg via INTRAVENOUS

## 2012-12-30 MED ORDER — LIDOCAINE-EPINEPHRINE 1 %-1:100000 IJ SOLN
INTRAMUSCULAR | Status: DC | PRN
  Administered 2012-12-30: 7 mL

## 2012-12-30 MED ORDER — PROPOFOL 10 MG/ML IV BOLUS
INTRAVENOUS | Status: DC | PRN
  Administered 2012-12-30: 10 mg via INTRAVENOUS

## 2012-12-30 MED ORDER — LIDOCAINE HCL (CARDIAC) 20 MG/ML IV SOLN
INTRAVENOUS | Status: DC | PRN
  Administered 2012-12-30: 50 mg via INTRAVENOUS

## 2012-12-30 MED ORDER — FENTANYL CITRATE 0.05 MG/ML IJ SOLN
INTRAMUSCULAR | Status: DC | PRN
  Administered 2012-12-30: 50 ug via INTRAVENOUS

## 2012-12-30 MED ORDER — CEFAZOLIN SODIUM-DEXTROSE 2-3 GM-% IV SOLR
INTRAVENOUS | Status: DC | PRN
  Administered 2012-12-30: 2 g via INTRAVENOUS

## 2012-12-30 MED ORDER — EPHEDRINE SULFATE 50 MG/ML IJ SOLN
INTRAMUSCULAR | Status: DC | PRN
  Administered 2012-12-30: 10 mg via INTRAVENOUS
  Administered 2012-12-30: 20 mg via INTRAVENOUS

## 2012-12-30 MED ORDER — MIDAZOLAM HCL 2 MG/2ML IJ SOLN: 1.0000 mg | INTRAMUSCULAR | Status: DC | PRN

## 2012-12-30 MED ORDER — LACTATED RINGERS IV SOLN
INTRAVENOUS | Status: DC
  Administered 2012-12-30 (×2): via INTRAVENOUS

## 2012-12-30 SURGICAL SUPPLY — 73 items
ADH SKN CLS APL DERMABOND .7 (GAUZE/BANDAGES/DRESSINGS)
APL SKNCLS STERI-STRIP NONHPOA (GAUZE/BANDAGES/DRESSINGS) ×2
BAG DECANTER FOR FLEXI CONT (MISCELLANEOUS) IMPLANT
BALL CTTN LRG ABS STRL LF (GAUZE/BANDAGES/DRESSINGS)
BANDAGE ELASTIC 3 VELCRO ST LF (GAUZE/BANDAGES/DRESSINGS) ×2 IMPLANT
BANDAGE ELASTIC 4 VELCRO ST LF (GAUZE/BANDAGES/DRESSINGS) IMPLANT
BANDAGE ELASTIC 6 VELCRO ST LF (GAUZE/BANDAGES/DRESSINGS) IMPLANT
BANDAGE GAUZE ELAST BULKY 4 IN (GAUZE/BANDAGES/DRESSINGS) ×2 IMPLANT
BENZOIN TINCTURE PRP APPL 2/3 (GAUZE/BANDAGES/DRESSINGS) ×2 IMPLANT
BLADE DERMATOME SS (BLADE) ×2 IMPLANT
BLADE HEX COATED 2.75 (ELECTRODE) ×2 IMPLANT
BLADE SURG 10 STRL SS (BLADE) ×2 IMPLANT
BLADE SURG 15 STRL LF DISP TIS (BLADE) ×3 IMPLANT
BLADE SURG 15 STRL SS (BLADE) ×6
BLADE SURG ROTATE 9660 (MISCELLANEOUS) IMPLANT
BNDG COHESIVE 4X5 TAN STRL (GAUZE/BANDAGES/DRESSINGS) IMPLANT
CLOTH BEACON ORANGE TIMEOUT ST (SAFETY) ×3 IMPLANT
COTTONBALL LRG STERILE PKG (GAUZE/BANDAGES/DRESSINGS) IMPLANT
COVER MAYO STAND STRL (DRAPES) ×3 IMPLANT
COVER TABLE BACK 60X90 (DRAPES) ×3 IMPLANT
DECANTER SPIKE VIAL GLASS SM (MISCELLANEOUS) IMPLANT
DERMABOND ADVANCED (GAUZE/BANDAGES/DRESSINGS)
DERMABOND ADVANCED .7 DNX12 (GAUZE/BANDAGES/DRESSINGS) IMPLANT
DERMACARRIERS GRAFT 1 TO 1.5 (DISPOSABLE)
DRAPE EXTREMITY T 121X128X90 (DRAPE) IMPLANT
DRAPE INCISE IOBAN 66X45 STRL (DRAPES) IMPLANT
DRAPE PED LAPAROTOMY (DRAPES) ×2 IMPLANT
DRAPE SURG 17X23 STRL (DRAPES) IMPLANT
DRAPE U-SHAPE 76X120 STRL (DRAPES) IMPLANT
DRSG ADAPTIC 3X8 NADH LF (GAUZE/BANDAGES/DRESSINGS) IMPLANT
DRSG EMULSION OIL 3X3 NADH (GAUZE/BANDAGES/DRESSINGS) IMPLANT
DRSG OPSITE 6X11 MED (GAUZE/BANDAGES/DRESSINGS) IMPLANT
DRSG PAD ABDOMINAL 8X10 ST (GAUZE/BANDAGES/DRESSINGS) IMPLANT
DRSG TEGADERM 4X10 (GAUZE/BANDAGES/DRESSINGS) IMPLANT
ELECT REM PT RETURN 9FT ADLT (ELECTROSURGICAL) ×3
ELECTRODE REM PT RTRN 9FT ADLT (ELECTROSURGICAL) ×1 IMPLANT
GLOVE BIO SURGEON STRL SZ 6.5 (GLOVE) ×3 IMPLANT
GLOVE SKINSENSE NS SZ6.5 (GLOVE) ×1
GLOVE SKINSENSE STRL SZ6.5 (GLOVE) ×1 IMPLANT
GOWN PREVENTION PLUS XLARGE (GOWN DISPOSABLE) ×8 IMPLANT
GRAFT DERMACARRIERS 1 TO 1.5 (DISPOSABLE) IMPLANT
NEEDLE 27GAX1X1/2 (NEEDLE) ×3 IMPLANT
NS IRRIG 1000ML POUR BTL (IV SOLUTION) ×3 IMPLANT
PACK BASIN DAY SURGERY FS (CUSTOM PROCEDURE TRAY) ×3 IMPLANT
PAD CAST 3X4 CTTN HI CHSV (CAST SUPPLIES) IMPLANT
PAD CAST 4YDX4 CTTN HI CHSV (CAST SUPPLIES) IMPLANT
PADDING CAST ABS 4INX4YD NS (CAST SUPPLIES)
PADDING CAST ABS COTTON 4X4 ST (CAST SUPPLIES) IMPLANT
PADDING CAST COTTON 3X4 STRL (CAST SUPPLIES)
PADDING CAST COTTON 4X4 STRL (CAST SUPPLIES)
PENCIL BUTTON HOLSTER BLD 10FT (ELECTRODE) ×2 IMPLANT
SHEET MEDIUM DRAPE 40X70 STRL (DRAPES) IMPLANT
SPONGE GAUZE 4X4 12PLY (GAUZE/BANDAGES/DRESSINGS) ×6 IMPLANT
SPONGE LAP 18X18 X RAY DECT (DISPOSABLE) ×3 IMPLANT
STAPLER VISISTAT 35W (STAPLE) IMPLANT
STOCKINETTE 4X48 STRL (DRAPES) IMPLANT
STOCKINETTE 6  STRL (DRAPES)
STOCKINETTE 6 STRL (DRAPES) IMPLANT
STOCKINETTE IMPERVIOUS LG (DRAPES) IMPLANT
STRIP CLOSURE SKIN 1/2X4 (GAUZE/BANDAGES/DRESSINGS) ×2 IMPLANT
SURGILUBE 2OZ TUBE FLIPTOP (MISCELLANEOUS) IMPLANT
SUT MON AB 5-0 PS2 18 (SUTURE) ×2 IMPLANT
SUT SILK 3 0 SH CR/8 (SUTURE) IMPLANT
SUT SILK 4 0 SH CR/8 (SUTURE) IMPLANT
SUT VIC AB 3-0 FS2 27 (SUTURE) IMPLANT
SUT VIC AB 5-0 P-3 18X BRD (SUTURE) ×1 IMPLANT
SUT VIC AB 5-0 P3 18 (SUTURE) ×3
SUT VICRYL 4-0 PS2 18IN ABS (SUTURE) ×2 IMPLANT
SYR BULB IRRIGATION 50ML (SYRINGE) ×3 IMPLANT
SYR CONTROL 10ML LL (SYRINGE) ×3 IMPLANT
TOWEL OR 17X24 6PK STRL BLUE (TOWEL DISPOSABLE) ×3 IMPLANT
TRAY DSU PREP LF (CUSTOM PROCEDURE TRAY) ×1 IMPLANT
UNDERPAD 30X30 INCONTINENT (UNDERPADS AND DIAPERS) ×2 IMPLANT

## 2012-12-30 NOTE — Brief Op Note (Signed)
**Note Shelia-Identified via Obfuscation** 12/30/2012  12:36 PM  PATIENT:  Shelia Holt  79 y.o. female  PRE-OPERATIVE DIAGNOSIS:  SQUAMOUS CELL CARCINOMA RIGHT CALF  POST-OPERATIVE DIAGNOSIS:  SQUAMOUS CELL CARCINOMA RIGHT CALF  PROCEDURE:  Procedure(s) (LRB) with comments: EXCISION MASS (Right) - EXCISION RIGHT POSTERIOR LEG SQUAMOUS CELL   SURGEON:  Surgeon(s) and Role:    * Claire Sanger, DO - Primary  PHYSICIAN ASSISTANT: none  ASSISTANTS: Harrie Foreman, LPN  ANESTHESIA:   general  EBL:  Total I/O In: 1000 [I.V.:1000] Out: - nil  BLOOD ADMINISTERED:none  DRAINS: none   LOCAL MEDICATIONS USED:  LIDOCAINE   SPECIMEN:  Source of Specimen:  right posterior lower leg skin cancer  DISPOSITION OF SPECIMEN:  PATHOLOGY  COUNTS:  YES  TOURNIQUET:  * No tourniquets in log *  DICTATION: dictated  PLAN OF CARE: Discharge to home after PACU  PATIENT DISPOSITION:  PACU - hemodynamically stable.   Delay start of Pharmacological VTE agent (>24hrs) due to surgical blood loss or risk of bleeding: no

## 2012-12-30 NOTE — Anesthesia Postprocedure Evaluation (Signed)
**Note Shelia-Identified via Obfuscation**   Anesthesia Post-op Note  Patient: Shelia Holt  Procedure(s) Performed: Procedure(s) (LRB) with comments: EXCISION MASS (Right) - EXCISION RIGHT POSTERIOR LEG SQUAMOUS CELL   Patient Location: PACU  Anesthesia Type:General  Level of Consciousness: awake and alert   Airway and Oxygen Therapy: Patient Spontanous Breathing and Patient connected to face mask oxygen  Post-op Pain: mild  Post-op Assessment: Post-op Vital signs reviewed  Post-op Vital Signs: Reviewed  Complications: No apparent anesthesia complications

## 2012-12-30 NOTE — Anesthesia Preprocedure Evaluation (Addendum)
Anesthesia Evaluation  Patient identified by MRN, date of birth, ID band Patient awake    Reviewed: Allergy & Precautions, H&P , NPO status , Patient's Chart, lab work & pertinent test results  Airway Mallampati: I TM Distance: >3 FB Neck ROM: Full    Dental  (+) Teeth Intact and Dental Advisory Given   Pulmonary COPD COPD inhaler,  breath sounds clear to auscultation        Cardiovascular hypertension, Pt. on medications Rhythm:Regular     Neuro/Psych    GI/Hepatic GERD-  Medicated and Controlled,  Endo/Other  diabetes, Well Controlled, Type 2, Oral Hypoglycemic Agents  Renal/GU      Musculoskeletal   Abdominal   Peds  Hematology   Anesthesia Other Findings   Reproductive/Obstetrics                           Anesthesia Physical Anesthesia Plan  ASA: III  Anesthesia Plan: General   Post-op Pain Management:    Induction: Intravenous  Airway Management Planned: LMA  Additional Equipment:   Intra-op Plan:   Post-operative Plan:   Informed Consent: I have reviewed the patients History and Physical, chart, labs and discussed the procedure including the risks, benefits and alternatives for the proposed anesthesia with the patient or authorized representative who has indicated his/her understanding and acceptance.   Dental advisory given  Plan Discussed with: CRNA, Anesthesiologist and Surgeon  Anesthesia Plan Comments:         Anesthesia Quick Evaluation

## 2012-12-30 NOTE — Interval H&P Note (Signed)
History and Physical Interval Note:  12/30/2012 8:55 AM  Shelia Holt  has presented today for surgery, with the diagnosis of SQUAMOUS CELL CARCINOMA  The various methods of treatment have been discussed with the patient and family. After consideration of risks, benefits and other options for treatment, the patient has consented to  Procedure(s) (LRB) with comments: SKIN GRAFT SPLIT THICKNESS (Left) - RIGHT POSTERIOR LEG SQUAMOUS CELL CARCINOMA EXCISION WITH SPLIT THICKNESS SKIN GRAFT AND ACELL PLACEMENT as a surgical intervention .  The patient's history has been reviewed, patient examined, no change in status, stable for surgery.  I have reviewed the patient's chart and labs.  Questions were answered to the patient's satisfaction.     SANGER,CLAIRE

## 2012-12-30 NOTE — Interval H&P Note (Signed)
History and Physical Interval Note:  12/30/2012 11:36 AM  Shelia Holt  has presented today for surgery, with the diagnosis of SQUAMOUS CELL CARCINOMA  The various methods of treatment have been discussed with the patient and family. After consideration of risks, benefits and other options for treatment, the patient has consented to  Procedure(s) (LRB) with comments: SKIN GRAFT SPLIT THICKNESS (Left) - RIGHT POSTERIOR LEG SQUAMOUS CELL CARCINOMA EXCISION WITH SPLIT THICKNESS SKIN GRAFT AND ACELL PLACEMENT as a surgical intervention .  The patient's history has been reviewed, patient examined, no change in status, stable for surgery.  I have reviewed the patient's chart and labs.  Questions were answered to the patient's satisfaction.     SANGER,CLAIRE

## 2012-12-30 NOTE — Anesthesia Procedure Notes (Signed)
Procedure Name: LMA Insertion Date/Time: 12/30/2012 12:05 PM Performed by: Zenia Resides D Pre-anesthesia Checklist: Patient identified, Emergency Drugs available, Suction available and Patient being monitored Patient Re-evaluated:Patient Re-evaluated prior to inductionOxygen Delivery Method: Circle System Utilized Preoxygenation: Pre-oxygenation with 100% oxygen Intubation Type: IV induction Ventilation: Mask ventilation without difficulty LMA: LMA inserted LMA Size: 4.0 Number of attempts: 1 Airway Equipment and Method: bite block Placement Confirmation: positive ETCO2 Tube secured with: Tape Dental Injury: Teeth and Oropharynx as per pre-operative assessment

## 2012-12-30 NOTE — H&P (View-Only) (Signed)
    This document contains confidential information from a [facility] medical record system and may be unauthenticated. Release may be made only with a valid authorization or in accordance with applicable policies of [facility] Center or its affiliates. This document must be maintained in a secure manner or discarded/destroyed as required by [facility] policy or by a confidential means such as shredding.   Shelia Holt   12/22/2012 1:00 PM Office Visit  MRN: [id number]  Department:  Plastic Surgery  Dept Phone: [phone]  Description: Female DOB: 12/31/1921  Provider: Claire Sanger, DO    Diagnoses  -  Squamous cell carcinoma   - Primary    199.1     Reason for Visit  -  Skin Problem     Vitals - Last Recorded     148/67  73  1.575 m (5' 2.01")  60.952 kg (134 lb 6 oz)  24.57 kg/m2       Subjective:    Patient ID: Shelia Holt is a 79 y.o. female.  HPI The patient is an 79 years old wf here for a history and physical for excision of a right posterior leg squamous cell carcinoma. She noticed the area getting larger, darker and raised over the past year. A biopsy done by dermatology showed a squamous cell carcinoma. The patient was referred from the general surgeons office. She has wide spread sun damage on all areas. The area on the posterior right lateral leg distal to the knee is 4 mm, raised with a dry skin appearance, slight blue discoloration at the edges. Ulcer in the center from the biopsied area. No sign of infection or similar lesion noted.   She has multiple medical conditions but is not on any blood thinners.  The following portions of the patient's history were reviewed and updated as appropriate: allergies, current medications, past family history, past medical history, past social history, past surgical history and problem list.  Review of Systems  Constitutional: Negative.   HENT: Negative.   Eyes: Negative.   Respiratory: Negative.     Cardiovascular: Negative.   Gastrointestinal: Negative.   Genitourinary: Negative.   Musculoskeletal: Negative.   Psychiatric/Behavioral: Negative.    Objective:   Physical Exam  Constitutional: She appears well-developed and well-nourished.  HENT:   Head: Normocephalic and atraumatic.  Eyes: Conjunctivae normal are normal. Pupils are equal, round, and reactive to light.  Cardiovascular: Normal rate.   Pulmonary/Chest: Effort normal.  Musculoskeletal: Normal range of motion.  Neurological: She is alert.  Skin: Skin is warm.  Psychiatric: She has a normal mood and affect. Her behavior is normal. Thought content normal.     Assessment:   1.  Squamous cell carcinoma        Plan:     Excision of right posterior leg skin cancer. May need to do a skin graft if the area is too large for primary closure. Risks and complications reviewed and consent signed.     Medications Ordered This Encounter     cephalexin (KEFLEX) 500 MG capsule 28  Take 1 capsule (500 mg total) by mouth 4 times daily.    HYDROcodone-acetaminophen (NORCO) 5-325 mg per tablet Take 1 tablet by mouth every 6 (six) hours as needed for 10 days for Pain.   

## 2012-12-30 NOTE — Transfer of Care (Signed)
**Note Shelia-Identified via Obfuscation** Immediate Anesthesia Transfer of Care Note  Patient: Shelia Holt  Procedure(s) Performed: Procedure(s) (LRB) with comments: EXCISION MASS (Right) - EXCISION RIGHT POSTERIOR LEG SQUAMOUS CELL   Patient Location: PACU  Anesthesia Type:General  Level of Consciousness: awake and alert   Airway & Oxygen Therapy: Patient Spontanous Breathing and Patient connected to face mask oxygen  Post-op Assessment: Report given to PACU RN and Post -op Vital signs reviewed and stable  Post vital signs: Reviewed and stable  Complications: No apparent anesthesia complications

## 2012-12-31 ENCOUNTER — Encounter (HOSPITAL_BASED_OUTPATIENT_CLINIC_OR_DEPARTMENT_OTHER): Payer: Self-pay | Admitting: Plastic Surgery

## 2012-12-31 NOTE — Op Note (Signed)
NAMEMYRAKLE, WINGLER NO.:  [id number]  MEDICAL RECORD NO.:  [id number]  LOCATION: [facility]             [facility]  PHYSICIAN:  Wayland Denis, DO      DATE OF BIRTH:  [birth year]  DATE OF PROCEDURE:  12/30/2012 DATE OF DISCHARGE:                              OPERATIVE REPORT   PREOPERATIVE DIAGNOSIS:  Squamous cell carcinoma, right leg posterior lower portion.  POSTOPERATIVE DIAGNOSIS:  Squamous cell carcinoma, right leg posterior lower portion.  PROCEDURE:  Excision of squamous cell carcinoma, 3 x 4 cm with primary closure.  ATTENDING SURGEON:  Wayland Denis, DO.  ASSISTANT:  Harrie Foreman, LPN.  ANESTHESIA:  General.  INDICATION FOR PROCEDURE:  The patient is an 79 year old female, who was diagnosed with squamous cell carcinoma.  She presents for excision.  The risks and complications were reviewed and included bleeding, pain, scar, risk of anesthesia, risk of recurrence, and positive margins.  The patient wished to proceed with the above-mentioned plan.  DESCRIPTION OF PROCEDURE:  The patient was taken to the operating room, placed on the operating room table in the supine position.  General anesthesia was administered.  Once adequate, a time-out was called.  All information was confirmed to be correct.  She was prepped and draped in the usual sterile fashion.  A 1% lidocaine with epinephrine was injected around the area after it was marked elliptically with a 5 mm border from the lesion.  After waiting several minutes for the epinephrine to take effect, a #15 blade was used to make an incision at the premarked site. This was marked once it was removed with a short stitch superior and long stitch anterior.  The deep layers were closed with 4-0 Vicryl and a running 4-0 Monocryl was used to reapproximate the skin.  Steri-Strips were applied.  The patient was wrapped with Kerlix and an Ace wrap.  She was allowed to wake up,  extubated, and taken to recovery in stable condition.     Wayland Denis, DO     CS/MEDQ  D:  12/30/2012  T:  12/30/2012  Job:  [id number]

## 2013-01-14 DIAGNOSIS — H4010X0 Unspecified open-angle glaucoma, stage unspecified: Secondary | ICD-10-CM | POA: Diagnosis not present

## 2013-01-14 DIAGNOSIS — H4011X0 Primary open-angle glaucoma, stage unspecified: Secondary | ICD-10-CM | POA: Diagnosis not present

## 2013-01-14 DIAGNOSIS — H409 Unspecified glaucoma: Secondary | ICD-10-CM | POA: Diagnosis not present

## 2013-01-15 DIAGNOSIS — Z9889 Other specified postprocedural states: Secondary | ICD-10-CM | POA: Diagnosis not present

## 2013-01-20 DIAGNOSIS — Z9889 Other specified postprocedural states: Secondary | ICD-10-CM | POA: Diagnosis not present

## 2013-02-01 DIAGNOSIS — H1830 Unspecified corneal membrane change: Secondary | ICD-10-CM | POA: Insufficient documentation

## 2013-02-17 ENCOUNTER — Other Ambulatory Visit (HOSPITAL_COMMUNITY): Payer: Self-pay | Admitting: Cardiovascular Disease

## 2013-03-03 ENCOUNTER — Encounter: Payer: Self-pay | Admitting: *Deleted

## 2013-03-04 ENCOUNTER — Encounter: Payer: Self-pay | Admitting: Cardiovascular Disease

## 2013-03-11 ENCOUNTER — Encounter (HOSPITAL_COMMUNITY): Payer: Medicare Other

## 2013-03-25 DIAGNOSIS — H409 Unspecified glaucoma: Secondary | ICD-10-CM | POA: Diagnosis not present

## 2013-03-25 DIAGNOSIS — J449 Chronic obstructive pulmonary disease, unspecified: Secondary | ICD-10-CM | POA: Diagnosis not present

## 2013-03-29 ENCOUNTER — Ambulatory Visit (HOSPITAL_COMMUNITY)
Admission: RE | Admit: 2013-03-29 | Discharge: 2013-03-29 | Disposition: A | Discharge status: Home / Self Care | Payer: Medicare Other | Source: Ambulatory Visit | Attending: Cardiovascular Disease | Admitting: Cardiovascular Disease

## 2013-03-29 DIAGNOSIS — E119 Type 2 diabetes mellitus without complications: Secondary | ICD-10-CM | POA: Diagnosis not present

## 2013-03-29 DIAGNOSIS — I6529 Occlusion and stenosis of unspecified carotid artery: Secondary | ICD-10-CM | POA: Diagnosis not present

## 2013-03-29 DIAGNOSIS — I10 Essential (primary) hypertension: Secondary | ICD-10-CM | POA: Diagnosis not present

## 2013-03-29 DIAGNOSIS — E782 Mixed hyperlipidemia: Secondary | ICD-10-CM | POA: Diagnosis not present

## 2013-03-29 NOTE — Progress Notes (Signed)
Carotid artery duplex doppler was completed. Brigido Mera RVT 

## 2013-04-13 DIAGNOSIS — H4011X0 Primary open-angle glaucoma, stage unspecified: Secondary | ICD-10-CM | POA: Diagnosis not present

## 2013-04-13 DIAGNOSIS — Z9889 Other specified postprocedural states: Secondary | ICD-10-CM | POA: Diagnosis not present

## 2013-04-13 DIAGNOSIS — H409 Unspecified glaucoma: Secondary | ICD-10-CM | POA: Diagnosis not present

## 2013-04-16 DIAGNOSIS — H31401 Unspecified choroidal detachment, right eye: Secondary | ICD-10-CM | POA: Insufficient documentation

## 2013-04-22 DIAGNOSIS — N2581 Secondary hyperparathyroidism of renal origin: Secondary | ICD-10-CM | POA: Diagnosis not present

## 2013-04-22 DIAGNOSIS — D649 Anemia, unspecified: Secondary | ICD-10-CM | POA: Diagnosis not present

## 2013-04-27 DIAGNOSIS — I129 Hypertensive chronic kidney disease with stage 1 through stage 4 chronic kidney disease, or unspecified chronic kidney disease: Secondary | ICD-10-CM | POA: Diagnosis not present

## 2013-04-27 DIAGNOSIS — D649 Anemia, unspecified: Secondary | ICD-10-CM | POA: Diagnosis not present

## 2013-04-27 DIAGNOSIS — E559 Vitamin D deficiency, unspecified: Secondary | ICD-10-CM | POA: Diagnosis not present

## 2013-07-09 DIAGNOSIS — H8110 Benign paroxysmal vertigo, unspecified ear: Secondary | ICD-10-CM | POA: Diagnosis not present

## 2013-07-09 DIAGNOSIS — J309 Allergic rhinitis, unspecified: Secondary | ICD-10-CM | POA: Diagnosis not present

## 2013-07-13 DIAGNOSIS — Z961 Presence of intraocular lens: Secondary | ICD-10-CM | POA: Diagnosis not present

## 2013-07-13 DIAGNOSIS — Z9889 Other specified postprocedural states: Secondary | ICD-10-CM | POA: Diagnosis not present

## 2013-07-13 DIAGNOSIS — H409 Unspecified glaucoma: Secondary | ICD-10-CM | POA: Diagnosis not present

## 2013-07-13 DIAGNOSIS — H4011X0 Primary open-angle glaucoma, stage unspecified: Secondary | ICD-10-CM | POA: Diagnosis not present

## 2013-07-14 DIAGNOSIS — I69998 Other sequelae following unspecified cerebrovascular disease: Secondary | ICD-10-CM | POA: Diagnosis not present

## 2013-07-21 DIAGNOSIS — I69998 Other sequelae following unspecified cerebrovascular disease: Secondary | ICD-10-CM | POA: Diagnosis not present

## 2013-07-27 DIAGNOSIS — E782 Mixed hyperlipidemia: Secondary | ICD-10-CM | POA: Diagnosis not present

## 2013-07-27 DIAGNOSIS — D509 Iron deficiency anemia, unspecified: Secondary | ICD-10-CM | POA: Diagnosis not present

## 2013-07-27 DIAGNOSIS — I6529 Occlusion and stenosis of unspecified carotid artery: Secondary | ICD-10-CM | POA: Diagnosis not present

## 2013-07-27 DIAGNOSIS — R42 Dizziness and giddiness: Secondary | ICD-10-CM | POA: Diagnosis not present

## 2013-07-27 DIAGNOSIS — Z23 Encounter for immunization: Secondary | ICD-10-CM | POA: Diagnosis not present

## 2013-07-27 DIAGNOSIS — E229 Hyperfunction of pituitary gland, unspecified: Secondary | ICD-10-CM | POA: Diagnosis not present

## 2013-07-27 DIAGNOSIS — Z79899 Other long term (current) drug therapy: Secondary | ICD-10-CM | POA: Diagnosis not present

## 2013-07-27 DIAGNOSIS — Z006 Encounter for examination for normal comparison and control in clinical research program: Secondary | ICD-10-CM | POA: Diagnosis not present

## 2013-07-27 DIAGNOSIS — I779 Disorder of arteries and arterioles, unspecified: Secondary | ICD-10-CM | POA: Diagnosis not present

## 2013-08-11 DIAGNOSIS — I10 Essential (primary) hypertension: Secondary | ICD-10-CM | POA: Diagnosis not present

## 2013-08-11 DIAGNOSIS — I951 Orthostatic hypotension: Secondary | ICD-10-CM | POA: Diagnosis not present

## 2013-08-13 DIAGNOSIS — H04129 Dry eye syndrome of unspecified lacrimal gland: Secondary | ICD-10-CM | POA: Diagnosis not present

## 2013-08-13 DIAGNOSIS — Z961 Presence of intraocular lens: Secondary | ICD-10-CM | POA: Diagnosis not present

## 2013-08-13 DIAGNOSIS — H2640 Unspecified secondary cataract: Secondary | ICD-10-CM | POA: Diagnosis not present

## 2013-08-13 DIAGNOSIS — H4011X0 Primary open-angle glaucoma, stage unspecified: Secondary | ICD-10-CM | POA: Diagnosis not present

## 2013-08-17 ENCOUNTER — Encounter: Payer: Self-pay | Admitting: Cardiovascular Disease

## 2013-09-14 DIAGNOSIS — H4011X0 Primary open-angle glaucoma, stage unspecified: Secondary | ICD-10-CM | POA: Diagnosis not present

## 2013-09-14 DIAGNOSIS — H168 Other keratitis: Secondary | ICD-10-CM | POA: Diagnosis not present

## 2013-09-14 DIAGNOSIS — H16223 Keratoconjunctivitis sicca, not specified as Sjogren's, bilateral: Secondary | ICD-10-CM

## 2013-09-14 DIAGNOSIS — H04129 Dry eye syndrome of unspecified lacrimal gland: Secondary | ICD-10-CM | POA: Diagnosis not present

## 2013-09-20 ENCOUNTER — Telehealth (HOSPITAL_COMMUNITY): Payer: Self-pay | Admitting: *Deleted

## 2013-09-20 DIAGNOSIS — I6523 Occlusion and stenosis of bilateral carotid arteries: Secondary | ICD-10-CM

## 2013-09-20 NOTE — Telephone Encounter (Signed)
Pts daughter would like to know if pt should have carotid doppler before her appt. If so please put the order in epic and I will call and schedule.

## 2013-09-23 NOTE — Telephone Encounter (Signed)
Per carotid doppler results of 03/29/13 it was to be repeated in 6 months.  Order placed to be done 09/29/13 AM.

## 2013-09-30 ENCOUNTER — Ambulatory Visit (INDEPENDENT_AMBULATORY_CARE_PROVIDER_SITE_OTHER): Payer: Medicare Other | Admitting: Cardiovascular Disease

## 2013-09-30 ENCOUNTER — Ambulatory Visit (HOSPITAL_COMMUNITY)
Admission: RE | Admit: 2013-09-30 | Discharge: 2013-09-30 | Disposition: A | Discharge status: Home / Self Care | Payer: Medicare Other | Source: Ambulatory Visit | Attending: Cardiovascular Disease | Admitting: Cardiovascular Disease

## 2013-09-30 ENCOUNTER — Encounter: Payer: Self-pay | Admitting: Cardiovascular Disease

## 2013-09-30 VITALS — BP 146/69 | HR 81 | Ht 64.0 in | Wt 139.8 lb

## 2013-09-30 DIAGNOSIS — I6529 Occlusion and stenosis of unspecified carotid artery: Secondary | ICD-10-CM

## 2013-09-30 DIAGNOSIS — E119 Type 2 diabetes mellitus without complications: Secondary | ICD-10-CM | POA: Diagnosis not present

## 2013-09-30 DIAGNOSIS — J449 Chronic obstructive pulmonary disease, unspecified: Secondary | ICD-10-CM

## 2013-09-30 DIAGNOSIS — D509 Iron deficiency anemia, unspecified: Secondary | ICD-10-CM

## 2013-09-30 DIAGNOSIS — I6522 Occlusion and stenosis of left carotid artery: Secondary | ICD-10-CM

## 2013-09-30 DIAGNOSIS — I10 Essential (primary) hypertension: Secondary | ICD-10-CM | POA: Diagnosis not present

## 2013-09-30 DIAGNOSIS — I6523 Occlusion and stenosis of bilateral carotid arteries: Secondary | ICD-10-CM

## 2013-09-30 DIAGNOSIS — E785 Hyperlipidemia, unspecified: Secondary | ICD-10-CM

## 2013-09-30 DIAGNOSIS — R079 Chest pain, unspecified: Secondary | ICD-10-CM

## 2013-09-30 NOTE — Patient Instructions (Signed)
Your physician recommends that you schedule a follow-up appointment in: 6 months  Your physician has requested that you have a carotid duplex. This test is an ultrasound of the carotid arteries in your neck. It looks at blood flow through these arteries that supply the brain with blood. Allow one hour for this exam. There are no restrictions or special instructions. Will schedule just before your next appointment

## 2013-09-30 NOTE — Progress Notes (Signed)
Carotid Duplex Completed. °Brianna L Mazza,RVT °

## 2013-10-03 DIAGNOSIS — I10 Essential (primary) hypertension: Secondary | ICD-10-CM | POA: Insufficient documentation

## 2013-10-03 DIAGNOSIS — E785 Hyperlipidemia, unspecified: Secondary | ICD-10-CM | POA: Insufficient documentation

## 2013-10-03 DIAGNOSIS — I6522 Occlusion and stenosis of left carotid artery: Secondary | ICD-10-CM | POA: Insufficient documentation

## 2013-10-03 DIAGNOSIS — E119 Type 2 diabetes mellitus without complications: Secondary | ICD-10-CM | POA: Insufficient documentation

## 2013-10-03 DIAGNOSIS — J449 Chronic obstructive pulmonary disease, unspecified: Secondary | ICD-10-CM | POA: Insufficient documentation

## 2013-10-03 DIAGNOSIS — D509 Iron deficiency anemia, unspecified: Secondary | ICD-10-CM | POA: Insufficient documentation

## 2013-10-03 NOTE — Assessment & Plan Note (Signed)
Borderline elevated blood pressure. Again in view of her age and the presence of carotid artery stenosis I don't think it is reasonable to shoot for a "perfect" systolic blood pressure of 130. No changes are made her medications today.

## 2013-10-03 NOTE — Assessment & Plan Note (Signed)
Her last carotid artery duplex study performed in May showed similar findings of the study performed 6 months earlier with a 70-99% stenosis in the left carotid bulb and a peak systolic velocity of 338 cm/s. We'll continue to follow on an every six-month basis. I did ask her to think about what her response would be to recommend treatment for the carotid stenosis. Which she acquiesced to carotid endarterectomy or carotid stenting should we deem this necessary? She has generally shown a preference for less aggressive treatment. If she would not want to carotid stenosis addressed with the surgical procedure, routine monitoring may be superfluous. She will think about it and tell me at our next visit.

## 2013-10-03 NOTE — Assessment & Plan Note (Signed)
This has recurred even when on low-dose aspirin. Currently not on antiplatelet drugs.

## 2013-10-03 NOTE — Assessment & Plan Note (Signed)
At age 79 had don't think it is justified to add a fibrate for her relatively mild hypertriglyceridemia. Her LDL cholesterol is well within the desirable range. No changes are made to her lipid medications today.
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
## 2013-10-04 ENCOUNTER — Encounter: Payer: Self-pay | Admitting: Cardiovascular Disease

## 2013-10-07 ENCOUNTER — Telehealth: Payer: Self-pay | Admitting: *Deleted

## 2013-10-07 DIAGNOSIS — I6523 Occlusion and stenosis of bilateral carotid arteries: Secondary | ICD-10-CM

## 2013-10-07 NOTE — Telephone Encounter (Signed)
Message copied by Vita Barley on Thu Oct 07, 2013  1:17 PM ------      Message from: Thurmon Fair      Created: Wed Oct 06, 2013  5:19 PM       There is slight further worsening of left carotid blockage. Not yet in surgical range in the absence of symptoms ------

## 2013-10-07 NOTE — Telephone Encounter (Signed)
Carotid doppler results called to patient.  Will do a repeat study in 6 months.  Patient voiced understanding.

## 2013-11-05 DIAGNOSIS — H409 Unspecified glaucoma: Secondary | ICD-10-CM | POA: Diagnosis not present

## 2013-11-05 DIAGNOSIS — H4011X0 Primary open-angle glaucoma, stage unspecified: Secondary | ICD-10-CM | POA: Diagnosis not present

## 2013-11-05 DIAGNOSIS — Z961 Presence of intraocular lens: Secondary | ICD-10-CM | POA: Diagnosis not present

## 2013-12-09 DIAGNOSIS — H4011X0 Primary open-angle glaucoma, stage unspecified: Secondary | ICD-10-CM | POA: Diagnosis not present

## 2013-12-09 DIAGNOSIS — H409 Unspecified glaucoma: Secondary | ICD-10-CM | POA: Diagnosis not present

## 2013-12-17 DIAGNOSIS — Z961 Presence of intraocular lens: Secondary | ICD-10-CM | POA: Diagnosis not present

## 2013-12-17 DIAGNOSIS — H2640 Unspecified secondary cataract: Secondary | ICD-10-CM | POA: Diagnosis not present

## 2013-12-17 DIAGNOSIS — H04129 Dry eye syndrome of unspecified lacrimal gland: Secondary | ICD-10-CM | POA: Diagnosis not present

## 2013-12-17 DIAGNOSIS — H4011X0 Primary open-angle glaucoma, stage unspecified: Secondary | ICD-10-CM | POA: Diagnosis not present

## 2013-12-28 DIAGNOSIS — H02109 Unspecified ectropion of unspecified eye, unspecified eyelid: Secondary | ICD-10-CM | POA: Diagnosis not present

## 2013-12-28 DIAGNOSIS — H04129 Dry eye syndrome of unspecified lacrimal gland: Secondary | ICD-10-CM | POA: Diagnosis not present

## 2013-12-28 DIAGNOSIS — H168 Other keratitis: Secondary | ICD-10-CM | POA: Diagnosis not present

## 2014-01-27 DIAGNOSIS — N183 Chronic kidney disease, stage 3 unspecified: Secondary | ICD-10-CM | POA: Diagnosis not present

## 2014-01-27 DIAGNOSIS — E559 Vitamin D deficiency, unspecified: Secondary | ICD-10-CM | POA: Diagnosis not present

## 2014-01-31 DIAGNOSIS — N183 Chronic kidney disease, stage 3 unspecified: Secondary | ICD-10-CM | POA: Diagnosis not present

## 2014-01-31 DIAGNOSIS — M948X9 Other specified disorders of cartilage, unspecified sites: Secondary | ICD-10-CM | POA: Diagnosis not present

## 2014-01-31 DIAGNOSIS — D631 Anemia in chronic kidney disease: Secondary | ICD-10-CM | POA: Diagnosis not present

## 2014-01-31 DIAGNOSIS — I129 Hypertensive chronic kidney disease with stage 1 through stage 4 chronic kidney disease, or unspecified chronic kidney disease: Secondary | ICD-10-CM | POA: Diagnosis not present

## 2014-03-08 DIAGNOSIS — H168 Other keratitis: Secondary | ICD-10-CM | POA: Diagnosis not present

## 2014-03-08 DIAGNOSIS — H02059 Trichiasis without entropian unspecified eye, unspecified eyelid: Secondary | ICD-10-CM | POA: Insufficient documentation

## 2014-03-08 DIAGNOSIS — H4011X0 Primary open-angle glaucoma, stage unspecified: Secondary | ICD-10-CM | POA: Diagnosis not present

## 2014-03-08 DIAGNOSIS — H409 Unspecified glaucoma: Secondary | ICD-10-CM | POA: Diagnosis not present

## 2014-03-22 ENCOUNTER — Telehealth (HOSPITAL_COMMUNITY): Payer: Self-pay | Admitting: *Deleted

## 2014-03-22 NOTE — Telephone Encounter (Signed)
Lets wait and schedule after patient sees Dr. Loletha Grayer 03/29/14.  If we do before this visit it will actually be less than 6 months from previous study and insurance will probably not pay.

## 2014-03-29 DIAGNOSIS — H543 Unqualified visual loss, both eyes: Secondary | ICD-10-CM | POA: Diagnosis not present

## 2014-03-29 DIAGNOSIS — H4011X0 Primary open-angle glaucoma, stage unspecified: Secondary | ICD-10-CM | POA: Diagnosis not present

## 2014-03-29 DIAGNOSIS — H409 Unspecified glaucoma: Secondary | ICD-10-CM | POA: Diagnosis not present

## 2014-03-29 DIAGNOSIS — H04129 Dry eye syndrome of unspecified lacrimal gland: Secondary | ICD-10-CM | POA: Diagnosis not present

## 2014-04-13 DIAGNOSIS — H543 Unqualified visual loss, both eyes: Secondary | ICD-10-CM | POA: Insufficient documentation

## 2014-04-19 DIAGNOSIS — H4011X0 Primary open-angle glaucoma, stage unspecified: Secondary | ICD-10-CM | POA: Diagnosis not present

## 2014-04-19 DIAGNOSIS — H409 Unspecified glaucoma: Secondary | ICD-10-CM | POA: Diagnosis not present

## 2014-04-21 DIAGNOSIS — H4011X0 Primary open-angle glaucoma, stage unspecified: Secondary | ICD-10-CM | POA: Diagnosis not present

## 2014-04-21 DIAGNOSIS — H10019 Acute follicular conjunctivitis, unspecified eye: Secondary | ICD-10-CM | POA: Diagnosis not present

## 2014-04-21 DIAGNOSIS — Z961 Presence of intraocular lens: Secondary | ICD-10-CM | POA: Diagnosis not present

## 2014-04-21 DIAGNOSIS — H3530 Unspecified macular degeneration: Secondary | ICD-10-CM | POA: Insufficient documentation

## 2014-04-21 DIAGNOSIS — H04129 Dry eye syndrome of unspecified lacrimal gland: Secondary | ICD-10-CM | POA: Diagnosis not present

## 2014-04-21 DIAGNOSIS — H409 Unspecified glaucoma: Secondary | ICD-10-CM | POA: Diagnosis not present

## 2014-05-12 DIAGNOSIS — H4011X0 Primary open-angle glaucoma, stage unspecified: Secondary | ICD-10-CM | POA: Diagnosis not present

## 2014-05-12 DIAGNOSIS — H409 Unspecified glaucoma: Secondary | ICD-10-CM | POA: Diagnosis not present

## 2014-05-20 DIAGNOSIS — H409 Unspecified glaucoma: Secondary | ICD-10-CM | POA: Diagnosis not present

## 2014-05-20 DIAGNOSIS — H02059 Trichiasis without entropian unspecified eye, unspecified eyelid: Secondary | ICD-10-CM | POA: Diagnosis not present

## 2014-05-20 DIAGNOSIS — H4011X0 Primary open-angle glaucoma, stage unspecified: Secondary | ICD-10-CM | POA: Diagnosis not present

## 2014-05-20 DIAGNOSIS — Z961 Presence of intraocular lens: Secondary | ICD-10-CM | POA: Diagnosis not present

## 2014-06-29 DIAGNOSIS — H4011X0 Primary open-angle glaucoma, stage unspecified: Secondary | ICD-10-CM | POA: Diagnosis not present

## 2014-06-29 DIAGNOSIS — Z961 Presence of intraocular lens: Secondary | ICD-10-CM | POA: Diagnosis not present

## 2014-06-29 DIAGNOSIS — H04129 Dry eye syndrome of unspecified lacrimal gland: Secondary | ICD-10-CM | POA: Diagnosis not present

## 2014-06-29 DIAGNOSIS — H3530 Unspecified macular degeneration: Secondary | ICD-10-CM | POA: Diagnosis not present

## 2014-07-22 DIAGNOSIS — M948X9 Other specified disorders of cartilage, unspecified sites: Secondary | ICD-10-CM | POA: Diagnosis not present

## 2014-07-22 DIAGNOSIS — D631 Anemia in chronic kidney disease: Secondary | ICD-10-CM | POA: Diagnosis not present

## 2014-07-22 DIAGNOSIS — N183 Chronic kidney disease, stage 3 unspecified: Secondary | ICD-10-CM | POA: Diagnosis not present

## 2014-07-28 DIAGNOSIS — E559 Vitamin D deficiency, unspecified: Secondary | ICD-10-CM | POA: Diagnosis not present

## 2014-07-28 DIAGNOSIS — N039 Chronic nephritic syndrome with unspecified morphologic changes: Secondary | ICD-10-CM | POA: Diagnosis not present

## 2014-07-28 DIAGNOSIS — N183 Chronic kidney disease, stage 3 unspecified: Secondary | ICD-10-CM | POA: Diagnosis not present

## 2014-07-28 DIAGNOSIS — D631 Anemia in chronic kidney disease: Secondary | ICD-10-CM | POA: Diagnosis not present

## 2014-07-28 DIAGNOSIS — I129 Hypertensive chronic kidney disease with stage 1 through stage 4 chronic kidney disease, or unspecified chronic kidney disease: Secondary | ICD-10-CM | POA: Diagnosis not present

## 2014-08-09 ENCOUNTER — Telehealth: Payer: Self-pay | Admitting: Cardiovascular Disease

## 2014-08-12 NOTE — Telephone Encounter (Signed)
Closed encounter °

## 2014-08-19 ENCOUNTER — Encounter: Payer: Self-pay | Admitting: Cardiovascular Disease

## 2014-08-19 DIAGNOSIS — J449 Chronic obstructive pulmonary disease, unspecified: Secondary | ICD-10-CM | POA: Diagnosis not present

## 2014-08-19 DIAGNOSIS — Z23 Encounter for immunization: Secondary | ICD-10-CM | POA: Diagnosis not present

## 2014-08-19 DIAGNOSIS — D352 Benign neoplasm of pituitary gland: Secondary | ICD-10-CM | POA: Diagnosis not present

## 2014-08-19 DIAGNOSIS — E782 Mixed hyperlipidemia: Secondary | ICD-10-CM | POA: Diagnosis not present

## 2014-08-19 DIAGNOSIS — D353 Benign neoplasm of craniopharyngeal duct: Secondary | ICD-10-CM | POA: Diagnosis not present

## 2014-08-19 DIAGNOSIS — Z0000 Encounter for general adult medical examination without abnormal findings: Secondary | ICD-10-CM | POA: Diagnosis not present

## 2014-08-19 DIAGNOSIS — M899 Disorder of bone, unspecified: Secondary | ICD-10-CM | POA: Diagnosis not present

## 2014-08-19 DIAGNOSIS — M949 Disorder of cartilage, unspecified: Secondary | ICD-10-CM | POA: Diagnosis not present

## 2014-08-19 DIAGNOSIS — D509 Iron deficiency anemia, unspecified: Secondary | ICD-10-CM | POA: Diagnosis not present

## 2014-08-25 DIAGNOSIS — I779 Disorder of arteries and arterioles, unspecified: Secondary | ICD-10-CM | POA: Diagnosis not present

## 2014-08-25 DIAGNOSIS — D509 Iron deficiency anemia, unspecified: Secondary | ICD-10-CM | POA: Diagnosis not present

## 2014-08-25 DIAGNOSIS — E782 Mixed hyperlipidemia: Secondary | ICD-10-CM | POA: Diagnosis not present

## 2014-08-25 DIAGNOSIS — J449 Chronic obstructive pulmonary disease, unspecified: Secondary | ICD-10-CM | POA: Diagnosis not present

## 2014-08-25 DIAGNOSIS — I10 Essential (primary) hypertension: Secondary | ICD-10-CM | POA: Diagnosis not present

## 2014-08-26 ENCOUNTER — Other Ambulatory Visit (HOSPITAL_COMMUNITY): Payer: Self-pay | Admitting: Respiratory Therapy

## 2014-08-31 ENCOUNTER — Encounter (HOSPITAL_COMMUNITY): Payer: Medicare Other

## 2014-09-12 ENCOUNTER — Ambulatory Visit: Payer: Medicare Other | Admitting: Cardiovascular Disease

## 2014-09-12 ENCOUNTER — Encounter (HOSPITAL_COMMUNITY): Payer: Medicare Other

## 2014-09-15 ENCOUNTER — Telehealth (HOSPITAL_COMMUNITY): Payer: Self-pay | Admitting: *Deleted

## 2014-09-19 ENCOUNTER — Ambulatory Visit (INDEPENDENT_AMBULATORY_CARE_PROVIDER_SITE_OTHER): Payer: Medicare Other | Admitting: Cardiovascular Disease

## 2014-09-19 ENCOUNTER — Ambulatory Visit (HOSPITAL_COMMUNITY)
Admission: RE | Admit: 2014-09-19 | Discharge: 2014-09-19 | Disposition: A | Discharge status: Home / Self Care | Payer: Medicare Other | Source: Ambulatory Visit | Attending: Cardiovascular Disease | Admitting: Cardiovascular Disease

## 2014-09-19 ENCOUNTER — Encounter: Payer: Self-pay | Admitting: Cardiovascular Disease

## 2014-09-19 ENCOUNTER — Encounter (HOSPITAL_COMMUNITY): Payer: Medicare Other

## 2014-09-19 VITALS — BP 160/60 | HR 87 | Resp 16 | Ht 63.0 in | Wt 136.2 lb

## 2014-09-19 DIAGNOSIS — I10 Essential (primary) hypertension: Secondary | ICD-10-CM

## 2014-09-19 DIAGNOSIS — D509 Iron deficiency anemia, unspecified: Secondary | ICD-10-CM | POA: Diagnosis not present

## 2014-09-19 DIAGNOSIS — I672 Cerebral atherosclerosis: Secondary | ICD-10-CM | POA: Insufficient documentation

## 2014-09-19 DIAGNOSIS — I6523 Occlusion and stenosis of bilateral carotid arteries: Secondary | ICD-10-CM

## 2014-09-19 DIAGNOSIS — E785 Hyperlipidemia, unspecified: Secondary | ICD-10-CM | POA: Diagnosis not present

## 2014-09-19 DIAGNOSIS — I779 Disorder of arteries and arterioles, unspecified: Secondary | ICD-10-CM

## 2014-09-19 DIAGNOSIS — I6522 Occlusion and stenosis of left carotid artery: Secondary | ICD-10-CM | POA: Diagnosis not present

## 2014-09-19 NOTE — Progress Notes (Signed)
Carotid Duplex Completed. Dorothie Wah, BS, RDMS, RVT  

## 2014-09-19 NOTE — Patient Instructions (Signed)
Your physician has recommended you make the following change in your medication: Continue your current medications.  [name] recommends that you schedule a follow-up appointment in: 6 months.

## 2014-09-20 ENCOUNTER — Encounter: Payer: Self-pay | Admitting: Cardiovascular Disease

## 2014-09-20 DIAGNOSIS — M25461 Effusion, right knee: Secondary | ICD-10-CM | POA: Diagnosis not present

## 2014-09-20 DIAGNOSIS — M1711 Unilateral primary osteoarthritis, right knee: Secondary | ICD-10-CM | POA: Diagnosis not present

## 2014-09-20 DIAGNOSIS — M2241 Chondromalacia patellae, right knee: Secondary | ICD-10-CM | POA: Diagnosis not present

## 2014-09-20 DIAGNOSIS — M25561 Pain in right knee: Secondary | ICD-10-CM | POA: Diagnosis not present
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
## 2014-09-22 ENCOUNTER — Other Ambulatory Visit (HOSPITAL_COMMUNITY): Payer: Self-pay | Admitting: Respiratory Therapy

## 2014-09-22 ENCOUNTER — Ambulatory Visit (HOSPITAL_COMMUNITY)
Admission: RE | Admit: 2014-09-22 | Discharge: 2014-09-22 | Disposition: A | Discharge status: Home / Self Care | Payer: Medicare Other | Source: Ambulatory Visit | Attending: Internal Medicine | Admitting: Internal Medicine

## 2014-09-22 DIAGNOSIS — R0602 Shortness of breath: Secondary | ICD-10-CM

## 2014-09-22 LAB — PULMONARY FUNCTION TEST
FEF 25-75 PRE: 0.26 L/s
FEF2575-%Pred-Pre: 35 %
FEV1-%Pred-Pre: 77 %
FEV1-PRE: 1.05 L
FEV1FVC-%PRED-PRE: 62 %
FEV6-%Pred-Pre: 108 %
FEV6-Pre: 1.88 L
FEV6FVC-%PRED-PRE: 86 %
FVC-%Pred-Pre: 125 %
FVC-Pre: 2.34 L
PRE FEV1/FVC RATIO: 45 %
PRE FEV6/FVC RATIO: 80 %

## 2014-09-26 DIAGNOSIS — J449 Chronic obstructive pulmonary disease, unspecified: Secondary | ICD-10-CM | POA: Diagnosis not present

## 2014-10-04 DIAGNOSIS — M25561 Pain in right knee: Secondary | ICD-10-CM | POA: Diagnosis not present

## 2014-10-04 DIAGNOSIS — M1711 Unilateral primary osteoarthritis, right knee: Secondary | ICD-10-CM | POA: Diagnosis not present

## 2014-10-04 DIAGNOSIS — M2241 Chondromalacia patellae, right knee: Secondary | ICD-10-CM | POA: Diagnosis not present

## 2014-10-04 DIAGNOSIS — M7121 Synovial cyst of popliteal space [Baker], right knee: Secondary | ICD-10-CM | POA: Diagnosis not present

## 2014-11-01 DIAGNOSIS — M7121 Synovial cyst of popliteal space [Baker], right knee: Secondary | ICD-10-CM | POA: Diagnosis not present

## 2014-11-01 DIAGNOSIS — M2241 Chondromalacia patellae, right knee: Secondary | ICD-10-CM | POA: Diagnosis not present

## 2014-11-01 DIAGNOSIS — M25561 Pain in right knee: Secondary | ICD-10-CM | POA: Diagnosis not present

## 2014-11-01 DIAGNOSIS — M1711 Unilateral primary osteoarthritis, right knee: Secondary | ICD-10-CM | POA: Diagnosis not present

## 2014-11-03 DIAGNOSIS — Z961 Presence of intraocular lens: Secondary | ICD-10-CM | POA: Diagnosis not present

## 2014-11-03 DIAGNOSIS — H04123 Dry eye syndrome of bilateral lacrimal glands: Secondary | ICD-10-CM | POA: Diagnosis not present

## 2014-11-03 DIAGNOSIS — H4011X3 Primary open-angle glaucoma, severe stage: Secondary | ICD-10-CM | POA: Diagnosis not present

## 2014-11-03 DIAGNOSIS — H26492 Other secondary cataract, left eye: Secondary | ICD-10-CM | POA: Diagnosis not present

## 2014-11-08 DIAGNOSIS — M2241 Chondromalacia patellae, right knee: Secondary | ICD-10-CM | POA: Diagnosis not present

## 2014-11-08 DIAGNOSIS — M25561 Pain in right knee: Secondary | ICD-10-CM | POA: Diagnosis not present

## 2014-11-08 DIAGNOSIS — M1711 Unilateral primary osteoarthritis, right knee: Secondary | ICD-10-CM | POA: Diagnosis not present

## 2014-11-08 DIAGNOSIS — M7121 Synovial cyst of popliteal space [Baker], right knee: Secondary | ICD-10-CM | POA: Diagnosis not present

## 2014-11-15 DIAGNOSIS — M2241 Chondromalacia patellae, right knee: Secondary | ICD-10-CM | POA: Diagnosis not present

## 2014-11-15 DIAGNOSIS — M1711 Unilateral primary osteoarthritis, right knee: Secondary | ICD-10-CM | POA: Diagnosis not present

## 2014-11-15 DIAGNOSIS — M7121 Synovial cyst of popliteal space [Baker], right knee: Secondary | ICD-10-CM | POA: Diagnosis not present

## 2014-11-15 DIAGNOSIS — M25561 Pain in right knee: Secondary | ICD-10-CM | POA: Diagnosis not present

## 2014-11-15 DIAGNOSIS — M1812 Unilateral primary osteoarthritis of first carpometacarpal joint, left hand: Secondary | ICD-10-CM | POA: Diagnosis not present

## 2014-11-15 DIAGNOSIS — M79645 Pain in left finger(s): Secondary | ICD-10-CM | POA: Diagnosis not present

## 2014-12-28 DIAGNOSIS — Z961 Presence of intraocular lens: Secondary | ICD-10-CM | POA: Diagnosis not present

## 2014-12-28 DIAGNOSIS — H4011X3 Primary open-angle glaucoma, severe stage: Secondary | ICD-10-CM | POA: Diagnosis not present

## 2014-12-28 DIAGNOSIS — H04123 Dry eye syndrome of bilateral lacrimal glands: Secondary | ICD-10-CM | POA: Diagnosis not present

## 2014-12-28 DIAGNOSIS — H029 Unspecified disorder of eyelid: Secondary | ICD-10-CM | POA: Diagnosis not present

## 2015-01-24 DIAGNOSIS — E559 Vitamin D deficiency, unspecified: Secondary | ICD-10-CM | POA: Diagnosis not present

## 2015-01-24 DIAGNOSIS — N183 Chronic kidney disease, stage 3 (moderate): Secondary | ICD-10-CM | POA: Diagnosis not present

## 2015-01-24 DIAGNOSIS — N189 Chronic kidney disease, unspecified: Secondary | ICD-10-CM | POA: Diagnosis not present

## 2015-01-26 DIAGNOSIS — D631 Anemia in chronic kidney disease: Secondary | ICD-10-CM | POA: Diagnosis not present

## 2015-01-26 DIAGNOSIS — E559 Vitamin D deficiency, unspecified: Secondary | ICD-10-CM | POA: Diagnosis not present

## 2015-01-26 DIAGNOSIS — I129 Hypertensive chronic kidney disease with stage 1 through stage 4 chronic kidney disease, or unspecified chronic kidney disease: Secondary | ICD-10-CM | POA: Diagnosis not present

## 2015-01-26 DIAGNOSIS — N183 Chronic kidney disease, stage 3 (moderate): Secondary | ICD-10-CM | POA: Diagnosis not present

## 2015-02-22 ENCOUNTER — Telehealth (HOSPITAL_COMMUNITY): Payer: Self-pay | Admitting: *Deleted

## 2015-02-28 DIAGNOSIS — M546 Pain in thoracic spine: Secondary | ICD-10-CM | POA: Diagnosis not present

## 2015-02-28 DIAGNOSIS — J439 Emphysema, unspecified: Secondary | ICD-10-CM | POA: Diagnosis not present

## 2015-03-28 DIAGNOSIS — M1711 Unilateral primary osteoarthritis, right knee: Secondary | ICD-10-CM | POA: Diagnosis not present

## 2015-03-28 DIAGNOSIS — M2241 Chondromalacia patellae, right knee: Secondary | ICD-10-CM | POA: Diagnosis not present

## 2015-04-06 DIAGNOSIS — M7121 Synovial cyst of popliteal space [Baker], right knee: Secondary | ICD-10-CM | POA: Diagnosis not present

## 2015-04-06 DIAGNOSIS — M1711 Unilateral primary osteoarthritis, right knee: Secondary | ICD-10-CM | POA: Diagnosis not present

## 2015-04-06 DIAGNOSIS — M2241 Chondromalacia patellae, right knee: Secondary | ICD-10-CM | POA: Diagnosis not present

## 2015-04-06 DIAGNOSIS — M25561 Pain in right knee: Secondary | ICD-10-CM | POA: Diagnosis not present

## 2015-04-12 DIAGNOSIS — H26492 Other secondary cataract, left eye: Secondary | ICD-10-CM | POA: Diagnosis not present

## 2015-04-12 DIAGNOSIS — H4011X3 Primary open-angle glaucoma, severe stage: Secondary | ICD-10-CM | POA: Diagnosis not present

## 2015-04-12 DIAGNOSIS — H04123 Dry eye syndrome of bilateral lacrimal glands: Secondary | ICD-10-CM | POA: Diagnosis not present

## 2015-04-12 DIAGNOSIS — Z961 Presence of intraocular lens: Secondary | ICD-10-CM | POA: Diagnosis not present

## 2015-04-15 ENCOUNTER — Other Ambulatory Visit: Payer: Self-pay | Admitting: Cardiovascular Disease

## 2015-04-21 ENCOUNTER — Telehealth: Payer: Self-pay | Admitting: *Deleted

## 2015-04-21 NOTE — Telephone Encounter (Signed)
NTG refill sent to pharmacy electronically.  States she had chest pain about 2 weeks ago relieved w/NTG.  Instructed if pain returns call office or go to ER.  Patient voiced understanding.

## 2015-04-27 DIAGNOSIS — H26492 Other secondary cataract, left eye: Secondary | ICD-10-CM | POA: Diagnosis not present

## 2015-05-22 DIAGNOSIS — H4011X3 Primary open-angle glaucoma, severe stage: Secondary | ICD-10-CM | POA: Diagnosis not present

## 2015-06-19 ENCOUNTER — Telehealth: Payer: Self-pay | Admitting: Cardiovascular Disease

## 2015-06-19 DIAGNOSIS — I10 Essential (primary) hypertension: Secondary | ICD-10-CM | POA: Diagnosis not present

## 2015-06-19 DIAGNOSIS — I959 Hypotension, unspecified: Secondary | ICD-10-CM | POA: Diagnosis not present

## 2015-06-19 NOTE — Telephone Encounter (Signed)
Shelia Holt called(lmsg on vm) in stating that the pt is experincing low BP and some dizzy spell and would like to see if the pt could come in and be seen today or soon. Please call  Thanks

## 2015-06-19 NOTE — Telephone Encounter (Signed)
Called, left message requesting call back.

## 2015-06-26 DIAGNOSIS — E1121 Type 2 diabetes mellitus with diabetic nephropathy: Secondary | ICD-10-CM | POA: Diagnosis not present

## 2015-06-26 DIAGNOSIS — E1142 Type 2 diabetes mellitus with diabetic polyneuropathy: Secondary | ICD-10-CM | POA: Diagnosis not present

## 2015-06-26 DIAGNOSIS — I10 Essential (primary) hypertension: Secondary | ICD-10-CM | POA: Diagnosis not present

## 2015-06-26 NOTE — Telephone Encounter (Signed)
Can this encounter be closed?

## 2015-06-26 NOTE — Telephone Encounter (Signed)
Returned call to patient's daughter [name] no answer.[place].

## 2015-06-29 DIAGNOSIS — I10 Essential (primary) hypertension: Secondary | ICD-10-CM | POA: Diagnosis not present

## 2015-07-25 DIAGNOSIS — Z23 Encounter for immunization: Secondary | ICD-10-CM | POA: Diagnosis not present

## 2015-07-25 DIAGNOSIS — I10 Essential (primary) hypertension: Secondary | ICD-10-CM | POA: Diagnosis not present

## 2015-09-19 DIAGNOSIS — Z0000 Encounter for general adult medical examination without abnormal findings: Secondary | ICD-10-CM | POA: Diagnosis not present

## 2015-09-19 DIAGNOSIS — E1142 Type 2 diabetes mellitus with diabetic polyneuropathy: Secondary | ICD-10-CM | POA: Diagnosis not present

## 2015-09-19 DIAGNOSIS — E1121 Type 2 diabetes mellitus with diabetic nephropathy: Secondary | ICD-10-CM | POA: Diagnosis not present

## 2015-09-19 DIAGNOSIS — I10 Essential (primary) hypertension: Secondary | ICD-10-CM | POA: Diagnosis not present

## 2015-09-25 DIAGNOSIS — E1121 Type 2 diabetes mellitus with diabetic nephropathy: Secondary | ICD-10-CM | POA: Diagnosis not present

## 2015-09-25 DIAGNOSIS — E1142 Type 2 diabetes mellitus with diabetic polyneuropathy: Secondary | ICD-10-CM | POA: Diagnosis not present

## 2015-09-25 DIAGNOSIS — I10 Essential (primary) hypertension: Secondary | ICD-10-CM | POA: Diagnosis not present

## 2015-09-25 DIAGNOSIS — L309 Dermatitis, unspecified: Secondary | ICD-10-CM | POA: Diagnosis not present

## 2015-09-28 DIAGNOSIS — L821 Other seborrheic keratosis: Secondary | ICD-10-CM | POA: Diagnosis not present

## 2015-09-28 DIAGNOSIS — L814 Other melanin hyperpigmentation: Secondary | ICD-10-CM | POA: Diagnosis not present

## 2015-09-28 DIAGNOSIS — L299 Pruritus, unspecified: Secondary | ICD-10-CM | POA: Diagnosis not present

## 2015-09-28 DIAGNOSIS — D1800 Hemangioma unspecified site: Secondary | ICD-10-CM | POA: Diagnosis not present

## 2015-09-28 DIAGNOSIS — D225 Melanocytic nevi of trunk: Secondary | ICD-10-CM | POA: Diagnosis not present

## 2015-09-28 DIAGNOSIS — D692 Other nonthrombocytopenic purpura: Secondary | ICD-10-CM | POA: Diagnosis not present

## 2015-09-28 DIAGNOSIS — L820 Inflamed seborrheic keratosis: Secondary | ICD-10-CM | POA: Diagnosis not present

## 2015-10-06 ENCOUNTER — Encounter: Payer: Self-pay | Admitting: Cardiovascular Disease

## 2015-10-06 ENCOUNTER — Ambulatory Visit (INDEPENDENT_AMBULATORY_CARE_PROVIDER_SITE_OTHER): Payer: Medicare Other | Admitting: Cardiovascular Disease

## 2015-10-06 VITALS — BP 122/56 | HR 80 | Resp 16 | Ht 63.0 in | Wt 133.0 lb

## 2015-10-06 DIAGNOSIS — N183 Chronic kidney disease, stage 3 unspecified: Secondary | ICD-10-CM

## 2015-10-06 DIAGNOSIS — I6522 Occlusion and stenosis of left carotid artery: Secondary | ICD-10-CM

## 2015-10-06 DIAGNOSIS — I10 Essential (primary) hypertension: Secondary | ICD-10-CM

## 2015-10-06 DIAGNOSIS — J449 Chronic obstructive pulmonary disease, unspecified: Secondary | ICD-10-CM

## 2015-10-06 DIAGNOSIS — E785 Hyperlipidemia, unspecified: Secondary | ICD-10-CM
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
## 2015-10-06 NOTE — Patient Instructions (Signed)
Dr. Croitoru recommends that you schedule a follow-up appointment in: ONE YEAR   

## 2015-10-07 ENCOUNTER — Encounter: Payer: Self-pay | Admitting: Cardiovascular Disease

## 2015-10-09 DIAGNOSIS — R262 Difficulty in walking, not elsewhere classified: Secondary | ICD-10-CM | POA: Diagnosis not present

## 2015-10-09 DIAGNOSIS — H8111 Benign paroxysmal vertigo, right ear: Secondary | ICD-10-CM | POA: Diagnosis not present

## 2015-10-10 DIAGNOSIS — H04123 Dry eye syndrome of bilateral lacrimal glands: Secondary | ICD-10-CM | POA: Diagnosis not present

## 2015-10-10 DIAGNOSIS — Z961 Presence of intraocular lens: Secondary | ICD-10-CM | POA: Diagnosis not present

## 2015-10-10 DIAGNOSIS — H401133 Primary open-angle glaucoma, bilateral, severe stage: Secondary | ICD-10-CM | POA: Diagnosis not present

## 2015-10-10 DIAGNOSIS — E119 Type 2 diabetes mellitus without complications: Secondary | ICD-10-CM | POA: Diagnosis not present

## 2015-10-13 ENCOUNTER — Encounter: Payer: Self-pay | Admitting: Cardiovascular Disease

## 2015-10-17 DIAGNOSIS — R262 Difficulty in walking, not elsewhere classified: Secondary | ICD-10-CM | POA: Diagnosis not present

## 2015-10-17 DIAGNOSIS — H8111 Benign paroxysmal vertigo, right ear: Secondary | ICD-10-CM | POA: Diagnosis not present

## 2015-12-14 DIAGNOSIS — N183 Chronic kidney disease, stage 3 (moderate): Secondary | ICD-10-CM | POA: Diagnosis not present

## 2015-12-14 DIAGNOSIS — N189 Chronic kidney disease, unspecified: Secondary | ICD-10-CM | POA: Diagnosis not present

## 2015-12-14 DIAGNOSIS — E559 Vitamin D deficiency, unspecified: Secondary | ICD-10-CM | POA: Diagnosis not present

## 2015-12-20 DIAGNOSIS — M9080 Osteopathy in diseases classified elsewhere, unspecified site: Secondary | ICD-10-CM | POA: Diagnosis not present

## 2015-12-20 DIAGNOSIS — I129 Hypertensive chronic kidney disease with stage 1 through stage 4 chronic kidney disease, or unspecified chronic kidney disease: Secondary | ICD-10-CM | POA: Diagnosis not present

## 2015-12-20 DIAGNOSIS — D631 Anemia in chronic kidney disease: Secondary | ICD-10-CM | POA: Diagnosis not present

## 2015-12-20 DIAGNOSIS — N183 Chronic kidney disease, stage 3 (moderate): Secondary | ICD-10-CM | POA: Diagnosis not present

## 2015-12-20 DIAGNOSIS — E889 Metabolic disorder, unspecified: Secondary | ICD-10-CM | POA: Diagnosis not present

## 2015-12-27 ENCOUNTER — Encounter: Payer: Self-pay | Admitting: *Deleted

## 2015-12-27 ENCOUNTER — Ambulatory Visit (INDEPENDENT_AMBULATORY_CARE_PROVIDER_SITE_OTHER): Payer: Medicare Other | Admitting: Diagnostic Neuroimaging

## 2015-12-27 VITALS — BP 116/62 | HR 72 | Ht 63.0 in | Wt 133.6 lb

## 2015-12-27 DIAGNOSIS — M4806 Spinal stenosis, lumbar region: Secondary | ICD-10-CM | POA: Diagnosis not present

## 2015-12-27 DIAGNOSIS — R55 Syncope and collapse: Secondary | ICD-10-CM

## 2015-12-27 DIAGNOSIS — M48062 Spinal stenosis, lumbar region with neurogenic claudication: Secondary | ICD-10-CM

## 2015-12-27 DIAGNOSIS — R42 Dizziness and giddiness: Secondary | ICD-10-CM | POA: Diagnosis not present

## 2015-12-27 DIAGNOSIS — D352 Benign neoplasm of pituitary gland: Secondary | ICD-10-CM

## 2015-12-27 DIAGNOSIS — E1142 Type 2 diabetes mellitus with diabetic polyneuropathy: Secondary | ICD-10-CM | POA: Diagnosis not present
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
## 2015-12-27 NOTE — Patient Instructions (Signed)
Thank you for coming to see [place] at [facility]. I hope we have been able to provide you high quality care today.  You may receive a patient satisfaction survey over the next few weeks. We would appreciate your feedback and comments so that we may continue to improve ourselves and the health of our patients.  - I will check MRI brain - consider rollator walker and PT evaluation   ~~~~~~~~~~~~~~~~~~~~~~~~~~~~~~~~~~~~~~~~~~~~~~~~~~~~~~~~~~~~~~~~~  DR. PENUMALLI'S GUIDE TO HAPPY AND HEALTHY LIVING These are some of my general health and wellness recommendations. Some of them may apply to you better than others. Please use common sense as you try these suggestions and feel free to ask me any questions.   ACTIVITY/FITNESS Mental, social, emotional and physical stimulation are very important for brain and body health. Try learning a new activity (arts, music, language, sports, games).  Keep moving your body to the best of your abilities. You can do this at home, inside or outside, the park, community center, gym or anywhere you like. Consider a physical therapist or personal trainer to get started. Consider the app Sworkit. Fitness trackers such as smart-watches, smart-phones or Fitbits can help as well.   NUTRITION Eat more plants: colorful vegetables, nuts, seeds and berries.  Eat less sugar, salt, preservatives and processed foods.  Avoid toxins such as cigarettes and alcohol.  Drink water when you are thirsty. Warm water with a slice of lemon is an excellent morning drink to start the day.  Consider these websites for more information The Nutrition Source (https://www.henry-hernandez.biz/) Precision Nutrition (WindowBlog.ch)   RELAXATION Consider practicing mindfulness meditation or other relaxation techniques such as deep breathing, prayer, yoga, tai chi, massage. See website mindful.org or the apps Headspace or Calm to  help get started.   SLEEP Try to get at least 7-8+ hours sleep per day. Regular exercise and reduced caffeine will help you sleep better. Practice good sleep hygeine techniques. See website sleep.org for more information.   PLANNING Prepare estate planning, living will, healthcare POA documents. Sometimes this is best planned with the help of an attorney. Theconversationproject.org and agingwithdignity.org are excellent resources.

## 2016-01-04 ENCOUNTER — Ambulatory Visit
Admission: RE | Admit: 2016-01-04 | Discharge: 2016-01-04 | Disposition: A | Discharge status: Home / Self Care | Payer: Medicare Other | Source: Ambulatory Visit | Attending: Diagnostic Neuroimaging | Admitting: Diagnostic Neuroimaging

## 2016-01-04 DIAGNOSIS — R55 Syncope and collapse: Secondary | ICD-10-CM

## 2016-01-04 DIAGNOSIS — D352 Benign neoplasm of pituitary gland: Secondary | ICD-10-CM

## 2016-01-04 DIAGNOSIS — R42 Dizziness and giddiness: Secondary | ICD-10-CM

## 2016-01-04 MED ORDER — GADOBENATE DIMEGLUMINE 529 MG/ML IV SOLN
6.0000 mL | Freq: Once | INTRAVENOUS | Status: AC | PRN
  Administered 2016-01-04: 6 mL via INTRAVENOUS

## 2016-01-10 DIAGNOSIS — H04123 Dry eye syndrome of bilateral lacrimal glands: Secondary | ICD-10-CM | POA: Diagnosis not present

## 2016-01-10 DIAGNOSIS — E119 Type 2 diabetes mellitus without complications: Secondary | ICD-10-CM | POA: Diagnosis not present

## 2016-01-10 DIAGNOSIS — H401133 Primary open-angle glaucoma, bilateral, severe stage: Secondary | ICD-10-CM | POA: Diagnosis not present

## 2016-01-10 DIAGNOSIS — Z961 Presence of intraocular lens: Secondary | ICD-10-CM | POA: Diagnosis not present

## 2016-01-17 ENCOUNTER — Telehealth: Payer: Self-pay | Admitting: Diagnostic Neuroimaging

## 2016-01-17 NOTE — Telephone Encounter (Signed)
Per Dr Leta Baptist, spoke with daughter and informed her patient's MRI showed nothing concerning, was unremarkable with some age-related changes. Also informed her that no definite pituitary mass was identified. She asked if patient needed to FU, transferred her call to Dr Leta Baptist.

## 2016-01-17 NOTE — Telephone Encounter (Signed)
Pt's daughter called requesting MRI results

## 2016-01-17 NOTE — Telephone Encounter (Signed)
Discussed with daughter. No major findings. May follow up with cardiology re: syncope eval. May follow up with me in May 2017.

## 2016-02-16 DIAGNOSIS — R109 Unspecified abdominal pain: Secondary | ICD-10-CM | POA: Diagnosis not present

## 2016-02-16 DIAGNOSIS — J069 Acute upper respiratory infection, unspecified: Secondary | ICD-10-CM | POA: Diagnosis not present

## 2016-02-16 DIAGNOSIS — K219 Gastro-esophageal reflux disease without esophagitis: Secondary | ICD-10-CM | POA: Diagnosis not present

## 2016-02-16 DIAGNOSIS — R1031 Right lower quadrant pain: Secondary | ICD-10-CM | POA: Diagnosis not present

## 2016-02-16 DIAGNOSIS — K5900 Constipation, unspecified: Secondary | ICD-10-CM | POA: Diagnosis not present

## 2016-03-13 ENCOUNTER — Other Ambulatory Visit: Payer: Self-pay | Admitting: Internal Medicine

## 2016-03-13 DIAGNOSIS — T402X5A Adverse effect of other opioids, initial encounter: Secondary | ICD-10-CM | POA: Diagnosis not present

## 2016-03-13 DIAGNOSIS — K219 Gastro-esophageal reflux disease without esophagitis: Secondary | ICD-10-CM | POA: Diagnosis not present

## 2016-03-13 DIAGNOSIS — R109 Unspecified abdominal pain: Secondary | ICD-10-CM | POA: Diagnosis not present

## 2016-03-19 ENCOUNTER — Telehealth: Payer: Self-pay | Admitting: Diagnostic Neuroimaging

## 2016-03-19 NOTE — Telephone Encounter (Signed)
Pt's daughter called request copy of MRI report dated 01/04/16

## 2016-03-20 ENCOUNTER — Telehealth: Payer: Self-pay | Admitting: *Deleted

## 2016-03-20 DIAGNOSIS — J4521 Mild intermittent asthma with (acute) exacerbation: Secondary | ICD-10-CM | POA: Diagnosis not present

## 2016-03-20 DIAGNOSIS — J45909 Unspecified asthma, uncomplicated: Secondary | ICD-10-CM | POA: Diagnosis not present

## 2016-03-20 DIAGNOSIS — R05 Cough: Secondary | ICD-10-CM | POA: Diagnosis not present

## 2016-03-20 NOTE — Telephone Encounter (Signed)
I left a voice message, for patient daughter to call the office, I need a sign release form.

## 2016-03-20 NOTE — Telephone Encounter (Signed)
Left message no answer

## 2016-03-26 ENCOUNTER — Ambulatory Visit: Payer: Medicare Other | Admitting: Diagnostic Neuroimaging

## 2016-03-28 ENCOUNTER — Other Ambulatory Visit: Payer: Medicare Other

## 2016-04-05 ENCOUNTER — Other Ambulatory Visit: Payer: Medicare Other

## 2016-05-02 DIAGNOSIS — H401133 Primary open-angle glaucoma, bilateral, severe stage: Secondary | ICD-10-CM | POA: Diagnosis not present

## 2016-05-02 DIAGNOSIS — H43392 Other vitreous opacities, left eye: Secondary | ICD-10-CM | POA: Diagnosis not present

## 2016-05-22 DIAGNOSIS — H401133 Primary open-angle glaucoma, bilateral, severe stage: Secondary | ICD-10-CM | POA: Diagnosis not present

## 2016-05-22 DIAGNOSIS — H04123 Dry eye syndrome of bilateral lacrimal glands: Secondary | ICD-10-CM | POA: Diagnosis not present

## 2016-05-22 DIAGNOSIS — Z961 Presence of intraocular lens: Secondary | ICD-10-CM | POA: Diagnosis not present

## 2016-06-03 DIAGNOSIS — H401133 Primary open-angle glaucoma, bilateral, severe stage: Secondary | ICD-10-CM | POA: Diagnosis not present

## 2016-07-04 DIAGNOSIS — H401133 Primary open-angle glaucoma, bilateral, severe stage: Secondary | ICD-10-CM | POA: Diagnosis not present

## 2016-07-24 DIAGNOSIS — H401133 Primary open-angle glaucoma, bilateral, severe stage: Secondary | ICD-10-CM | POA: Diagnosis not present

## 2016-08-09 DIAGNOSIS — M7062 Trochanteric bursitis, left hip: Secondary | ICD-10-CM | POA: Diagnosis not present

## 2016-08-09 DIAGNOSIS — M4126 Other idiopathic scoliosis, lumbar region: Secondary | ICD-10-CM | POA: Diagnosis not present

## 2016-08-09 DIAGNOSIS — M4716 Other spondylosis with myelopathy, lumbar region: Secondary | ICD-10-CM | POA: Diagnosis not present

## 2016-08-09 DIAGNOSIS — M5442 Lumbago with sciatica, left side: Secondary | ICD-10-CM | POA: Diagnosis not present

## 2016-08-09 DIAGNOSIS — M5136 Other intervertebral disc degeneration, lumbar region: Secondary | ICD-10-CM | POA: Diagnosis not present

## 2016-08-16 DIAGNOSIS — H04123 Dry eye syndrome of bilateral lacrimal glands: Secondary | ICD-10-CM | POA: Diagnosis not present

## 2016-08-16 DIAGNOSIS — Z961 Presence of intraocular lens: Secondary | ICD-10-CM | POA: Diagnosis not present

## 2016-08-16 DIAGNOSIS — H401133 Primary open-angle glaucoma, bilateral, severe stage: Secondary | ICD-10-CM | POA: Diagnosis not present

## 2016-09-02 DIAGNOSIS — Z23 Encounter for immunization: Secondary | ICD-10-CM | POA: Diagnosis not present

## 2016-09-02 DIAGNOSIS — Z1389 Encounter for screening for other disorder: Secondary | ICD-10-CM | POA: Diagnosis not present

## 2016-09-02 DIAGNOSIS — Z9181 History of falling: Secondary | ICD-10-CM | POA: Diagnosis not present

## 2016-09-02 DIAGNOSIS — L0390 Cellulitis, unspecified: Secondary | ICD-10-CM | POA: Diagnosis not present

## 2016-09-09 DIAGNOSIS — L03119 Cellulitis of unspecified part of limb: Secondary | ICD-10-CM | POA: Diagnosis not present

## 2016-09-12 DIAGNOSIS — L03119 Cellulitis of unspecified part of limb: Secondary | ICD-10-CM | POA: Diagnosis not present

## 2016-09-17 DIAGNOSIS — L03119 Cellulitis of unspecified part of limb: Secondary | ICD-10-CM | POA: Diagnosis not present

## 2016-09-18 ENCOUNTER — Encounter (HOSPITAL_BASED_OUTPATIENT_CLINIC_OR_DEPARTMENT_OTHER): Payer: Medicare Other | Attending: Surgery

## 2016-09-18 DIAGNOSIS — K219 Gastro-esophageal reflux disease without esophagitis: Secondary | ICD-10-CM | POA: Diagnosis not present

## 2016-09-18 DIAGNOSIS — J449 Chronic obstructive pulmonary disease, unspecified: Secondary | ICD-10-CM | POA: Insufficient documentation

## 2016-09-18 DIAGNOSIS — M1990 Unspecified osteoarthritis, unspecified site: Secondary | ICD-10-CM | POA: Insufficient documentation

## 2016-09-18 DIAGNOSIS — I129 Hypertensive chronic kidney disease with stage 1 through stage 4 chronic kidney disease, or unspecified chronic kidney disease: Secondary | ICD-10-CM | POA: Diagnosis not present

## 2016-09-18 DIAGNOSIS — I341 Nonrheumatic mitral (valve) prolapse: Secondary | ICD-10-CM | POA: Diagnosis not present

## 2016-09-18 DIAGNOSIS — N189 Chronic kidney disease, unspecified: Secondary | ICD-10-CM | POA: Insufficient documentation

## 2016-09-18 DIAGNOSIS — Z7984 Long term (current) use of oral hypoglycemic drugs: Secondary | ICD-10-CM | POA: Insufficient documentation

## 2016-09-18 DIAGNOSIS — L97822 Non-pressure chronic ulcer of other part of left lower leg with fat layer exposed: Secondary | ICD-10-CM | POA: Diagnosis not present

## 2016-09-18 DIAGNOSIS — Z87891 Personal history of nicotine dependence: Secondary | ICD-10-CM | POA: Diagnosis not present

## 2016-09-18 DIAGNOSIS — Z79899 Other long term (current) drug therapy: Secondary | ICD-10-CM | POA: Insufficient documentation

## 2016-09-18 DIAGNOSIS — E1122 Type 2 diabetes mellitus with diabetic chronic kidney disease: Secondary | ICD-10-CM | POA: Insufficient documentation

## 2016-09-18 DIAGNOSIS — L97212 Non-pressure chronic ulcer of right calf with fat layer exposed: Secondary | ICD-10-CM | POA: Diagnosis not present

## 2016-09-18 DIAGNOSIS — L97821 Non-pressure chronic ulcer of other part of left lower leg limited to breakdown of skin: Secondary | ICD-10-CM | POA: Insufficient documentation

## 2016-09-18 DIAGNOSIS — E11622 Type 2 diabetes mellitus with other skin ulcer: Secondary | ICD-10-CM | POA: Diagnosis not present

## 2016-09-18 DIAGNOSIS — L97811 Non-pressure chronic ulcer of other part of right lower leg limited to breakdown of skin: Secondary | ICD-10-CM | POA: Diagnosis not present

## 2016-09-18 DIAGNOSIS — E1165 Type 2 diabetes mellitus with hyperglycemia: Secondary | ICD-10-CM | POA: Insufficient documentation

## 2016-09-18 DIAGNOSIS — E1142 Type 2 diabetes mellitus with diabetic polyneuropathy: Secondary | ICD-10-CM | POA: Diagnosis not present

## 2016-09-18 DIAGNOSIS — S81811A Laceration without foreign body, right lower leg, initial encounter: Secondary | ICD-10-CM | POA: Diagnosis not present

## 2016-09-19 DIAGNOSIS — M4126 Other idiopathic scoliosis, lumbar region: Secondary | ICD-10-CM | POA: Diagnosis not present

## 2016-09-19 DIAGNOSIS — M7062 Trochanteric bursitis, left hip: Secondary | ICD-10-CM | POA: Diagnosis not present

## 2016-09-19 DIAGNOSIS — M25552 Pain in left hip: Secondary | ICD-10-CM | POA: Diagnosis not present

## 2016-09-19 DIAGNOSIS — M4716 Other spondylosis with myelopathy, lumbar region: Secondary | ICD-10-CM | POA: Diagnosis not present

## 2016-09-23 ENCOUNTER — Ambulatory Visit: Payer: Medicare Other | Admitting: Surgery

## 2016-09-25 ENCOUNTER — Encounter (HOSPITAL_BASED_OUTPATIENT_CLINIC_OR_DEPARTMENT_OTHER): Payer: Medicare Other | Attending: Surgery

## 2016-09-25 DIAGNOSIS — S81812A Laceration without foreign body, left lower leg, initial encounter: Secondary | ICD-10-CM | POA: Diagnosis not present

## 2016-09-25 DIAGNOSIS — E11622 Type 2 diabetes mellitus with other skin ulcer: Secondary | ICD-10-CM | POA: Diagnosis not present

## 2016-09-25 DIAGNOSIS — L97821 Non-pressure chronic ulcer of other part of left lower leg limited to breakdown of skin: Secondary | ICD-10-CM | POA: Diagnosis not present

## 2016-09-25 DIAGNOSIS — S81811A Laceration without foreign body, right lower leg, initial encounter: Secondary | ICD-10-CM | POA: Diagnosis not present

## 2016-09-25 DIAGNOSIS — J449 Chronic obstructive pulmonary disease, unspecified: Secondary | ICD-10-CM | POA: Insufficient documentation

## 2016-09-25 DIAGNOSIS — L97811 Non-pressure chronic ulcer of other part of right lower leg limited to breakdown of skin: Secondary | ICD-10-CM | POA: Diagnosis not present

## 2016-09-25 DIAGNOSIS — I10 Essential (primary) hypertension: Secondary | ICD-10-CM | POA: Diagnosis not present

## 2016-09-25 DIAGNOSIS — E1165 Type 2 diabetes mellitus with hyperglycemia: Secondary | ICD-10-CM | POA: Diagnosis not present

## 2016-09-25 DIAGNOSIS — E1142 Type 2 diabetes mellitus with diabetic polyneuropathy: Secondary | ICD-10-CM | POA: Diagnosis not present

## 2016-09-25 DIAGNOSIS — Z87891 Personal history of nicotine dependence: Secondary | ICD-10-CM | POA: Insufficient documentation

## 2016-09-25 DIAGNOSIS — L97812 Non-pressure chronic ulcer of other part of right lower leg with fat layer exposed: Secondary | ICD-10-CM | POA: Diagnosis not present

## 2016-09-30 DIAGNOSIS — H401133 Primary open-angle glaucoma, bilateral, severe stage: Secondary | ICD-10-CM | POA: Diagnosis not present

## 2016-09-30 DIAGNOSIS — H04123 Dry eye syndrome of bilateral lacrimal glands: Secondary | ICD-10-CM | POA: Diagnosis not present

## 2016-09-30 DIAGNOSIS — Z961 Presence of intraocular lens: Secondary | ICD-10-CM | POA: Diagnosis not present

## 2016-10-01 ENCOUNTER — Encounter (HOSPITAL_BASED_OUTPATIENT_CLINIC_OR_DEPARTMENT_OTHER): Payer: Medicare Other

## 2016-10-01 DIAGNOSIS — S81802A Unspecified open wound, left lower leg, initial encounter: Secondary | ICD-10-CM | POA: Diagnosis not present

## 2016-10-01 DIAGNOSIS — S81801A Unspecified open wound, right lower leg, initial encounter: Secondary | ICD-10-CM | POA: Diagnosis not present

## 2016-10-01 DIAGNOSIS — E11622 Type 2 diabetes mellitus with other skin ulcer: Secondary | ICD-10-CM | POA: Diagnosis not present

## 2016-10-01 DIAGNOSIS — L97821 Non-pressure chronic ulcer of other part of left lower leg limited to breakdown of skin: Secondary | ICD-10-CM | POA: Diagnosis not present

## 2016-10-01 DIAGNOSIS — E118 Type 2 diabetes mellitus with unspecified complications: Secondary | ICD-10-CM | POA: Diagnosis not present

## 2016-10-01 DIAGNOSIS — L97811 Non-pressure chronic ulcer of other part of right lower leg limited to breakdown of skin: Secondary | ICD-10-CM | POA: Diagnosis not present

## 2016-10-01 DIAGNOSIS — I10 Essential (primary) hypertension: Secondary | ICD-10-CM | POA: Diagnosis not present

## 2016-10-01 DIAGNOSIS — J449 Chronic obstructive pulmonary disease, unspecified: Secondary | ICD-10-CM | POA: Diagnosis not present

## 2016-10-01 DIAGNOSIS — E1165 Type 2 diabetes mellitus with hyperglycemia: Secondary | ICD-10-CM | POA: Diagnosis not present

## 2016-10-02 DIAGNOSIS — E559 Vitamin D deficiency, unspecified: Secondary | ICD-10-CM | POA: Diagnosis not present

## 2016-10-02 DIAGNOSIS — E1121 Type 2 diabetes mellitus with diabetic nephropathy: Secondary | ICD-10-CM | POA: Diagnosis not present

## 2016-10-02 DIAGNOSIS — D352 Benign neoplasm of pituitary gland: Secondary | ICD-10-CM | POA: Diagnosis not present

## 2016-10-02 DIAGNOSIS — E782 Mixed hyperlipidemia: Secondary | ICD-10-CM | POA: Diagnosis not present

## 2016-10-02 DIAGNOSIS — I10 Essential (primary) hypertension: Secondary | ICD-10-CM | POA: Diagnosis not present

## 2016-10-02 DIAGNOSIS — Z0000 Encounter for general adult medical examination without abnormal findings: Secondary | ICD-10-CM | POA: Diagnosis not present

## 2016-10-02 DIAGNOSIS — I779 Disorder of arteries and arterioles, unspecified: Secondary | ICD-10-CM | POA: Diagnosis not present

## 2016-10-02 DIAGNOSIS — M810 Age-related osteoporosis without current pathological fracture: Secondary | ICD-10-CM | POA: Diagnosis not present

## 2016-10-07 DIAGNOSIS — E1142 Type 2 diabetes mellitus with diabetic polyneuropathy: Secondary | ICD-10-CM | POA: Diagnosis not present

## 2016-10-07 DIAGNOSIS — Z23 Encounter for immunization: Secondary | ICD-10-CM | POA: Diagnosis not present

## 2016-10-07 DIAGNOSIS — R079 Chest pain, unspecified: Secondary | ICD-10-CM | POA: Diagnosis not present

## 2016-10-07 DIAGNOSIS — I10 Essential (primary) hypertension: Secondary | ICD-10-CM | POA: Diagnosis not present

## 2016-10-07 DIAGNOSIS — E1121 Type 2 diabetes mellitus with diabetic nephropathy: Secondary | ICD-10-CM | POA: Diagnosis not present

## 2016-10-08 ENCOUNTER — Telehealth: Payer: Self-pay | Admitting: Cardiovascular Disease

## 2016-10-08 NOTE — Telephone Encounter (Signed)
Records received from [facility] for apt on 10/24/16 with DR Croitoru. Records given to [facility]. CN

## 2016-10-09 DIAGNOSIS — L97811 Non-pressure chronic ulcer of other part of right lower leg limited to breakdown of skin: Secondary | ICD-10-CM | POA: Diagnosis not present

## 2016-10-09 DIAGNOSIS — L97212 Non-pressure chronic ulcer of right calf with fat layer exposed: Secondary | ICD-10-CM | POA: Diagnosis not present

## 2016-10-09 DIAGNOSIS — E11622 Type 2 diabetes mellitus with other skin ulcer: Secondary | ICD-10-CM | POA: Diagnosis not present

## 2016-10-09 DIAGNOSIS — E1165 Type 2 diabetes mellitus with hyperglycemia: Secondary | ICD-10-CM | POA: Diagnosis not present

## 2016-10-09 DIAGNOSIS — S81811A Laceration without foreign body, right lower leg, initial encounter: Secondary | ICD-10-CM | POA: Diagnosis not present

## 2016-10-09 DIAGNOSIS — I10 Essential (primary) hypertension: Secondary | ICD-10-CM | POA: Diagnosis not present

## 2016-10-09 DIAGNOSIS — S81812A Laceration without foreign body, left lower leg, initial encounter: Secondary | ICD-10-CM | POA: Diagnosis not present

## 2016-10-09 DIAGNOSIS — J449 Chronic obstructive pulmonary disease, unspecified: Secondary | ICD-10-CM | POA: Diagnosis not present

## 2016-10-09 DIAGNOSIS — L97821 Non-pressure chronic ulcer of other part of left lower leg limited to breakdown of skin: Secondary | ICD-10-CM | POA: Diagnosis not present

## 2016-10-16 DIAGNOSIS — L97811 Non-pressure chronic ulcer of other part of right lower leg limited to breakdown of skin: Secondary | ICD-10-CM | POA: Diagnosis not present

## 2016-10-16 DIAGNOSIS — S81811A Laceration without foreign body, right lower leg, initial encounter: Secondary | ICD-10-CM | POA: Diagnosis not present

## 2016-10-16 DIAGNOSIS — E11622 Type 2 diabetes mellitus with other skin ulcer: Secondary | ICD-10-CM | POA: Diagnosis not present

## 2016-10-16 DIAGNOSIS — J449 Chronic obstructive pulmonary disease, unspecified: Secondary | ICD-10-CM | POA: Diagnosis not present

## 2016-10-16 DIAGNOSIS — I10 Essential (primary) hypertension: Secondary | ICD-10-CM | POA: Diagnosis not present

## 2016-10-16 DIAGNOSIS — E1165 Type 2 diabetes mellitus with hyperglycemia: Secondary | ICD-10-CM | POA: Diagnosis not present

## 2016-10-16 DIAGNOSIS — L97821 Non-pressure chronic ulcer of other part of left lower leg limited to breakdown of skin: Secondary | ICD-10-CM | POA: Diagnosis not present

## 2016-10-24 ENCOUNTER — Ambulatory Visit: Payer: Medicare Other | Admitting: Cardiovascular Disease

## 2016-10-29 DIAGNOSIS — E1121 Type 2 diabetes mellitus with diabetic nephropathy: Secondary | ICD-10-CM | POA: Diagnosis not present

## 2016-10-30 ENCOUNTER — Encounter (HOSPITAL_BASED_OUTPATIENT_CLINIC_OR_DEPARTMENT_OTHER): Payer: Medicare Other | Attending: Surgery

## 2016-10-30 DIAGNOSIS — S81811A Laceration without foreign body, right lower leg, initial encounter: Secondary | ICD-10-CM | POA: Diagnosis not present

## 2016-10-30 DIAGNOSIS — J449 Chronic obstructive pulmonary disease, unspecified: Secondary | ICD-10-CM | POA: Insufficient documentation

## 2016-10-30 DIAGNOSIS — Z8631 Personal history of diabetic foot ulcer: Secondary | ICD-10-CM | POA: Insufficient documentation

## 2016-10-30 DIAGNOSIS — E119 Type 2 diabetes mellitus without complications: Secondary | ICD-10-CM | POA: Insufficient documentation

## 2016-10-30 DIAGNOSIS — L97212 Non-pressure chronic ulcer of right calf with fat layer exposed: Secondary | ICD-10-CM | POA: Diagnosis not present

## 2016-10-30 DIAGNOSIS — S81812A Laceration without foreign body, left lower leg, initial encounter: Secondary | ICD-10-CM | POA: Diagnosis not present

## 2016-10-30 DIAGNOSIS — I10 Essential (primary) hypertension: Secondary | ICD-10-CM | POA: Insufficient documentation

## 2016-10-30 DIAGNOSIS — L97811 Non-pressure chronic ulcer of other part of right lower leg limited to breakdown of skin: Secondary | ICD-10-CM | POA: Diagnosis not present

## 2016-10-30 DIAGNOSIS — Z09 Encounter for follow-up examination after completed treatment for conditions other than malignant neoplasm: Secondary | ICD-10-CM | POA: Insufficient documentation

## 2016-10-30 DIAGNOSIS — E11622 Type 2 diabetes mellitus with other skin ulcer: Secondary | ICD-10-CM | POA: Diagnosis not present

## 2016-12-03 ENCOUNTER — Ambulatory Visit: Payer: Medicare Other | Admitting: Cardiovascular Disease

## 2016-12-04 DIAGNOSIS — H401133 Primary open-angle glaucoma, bilateral, severe stage: Secondary | ICD-10-CM | POA: Diagnosis not present

## 2016-12-06 ENCOUNTER — Ambulatory Visit (INDEPENDENT_AMBULATORY_CARE_PROVIDER_SITE_OTHER): Payer: Medicare Other | Admitting: Cardiovascular Disease

## 2016-12-06 ENCOUNTER — Encounter: Payer: Self-pay | Admitting: Cardiovascular Disease

## 2016-12-06 VITALS — BP 128/61 | HR 78 | Ht 63.0 in | Wt 120.8 lb

## 2016-12-06 DIAGNOSIS — I6522 Occlusion and stenosis of left carotid artery: Secondary | ICD-10-CM | POA: Diagnosis not present

## 2016-12-06 DIAGNOSIS — E782 Mixed hyperlipidemia: Secondary | ICD-10-CM

## 2016-12-06 DIAGNOSIS — I10 Essential (primary) hypertension: Secondary | ICD-10-CM

## 2016-12-06 MED ORDER — HYDROCHLOROTHIAZIDE 12.5 MG PO CAPS: 12.5000 mg | ORAL_CAPSULE | ORAL | 11 refills | Status: DC
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
## 2016-12-06 NOTE — Patient Instructions (Signed)
Dr Sallyanne Kuster has recommended making the following medication changes: 1. DECREASE Hydrochlorothiazide to 4 days a week - Tuesdays, Thursdays, Saturdays, and Sundays  Your physician recommends that you schedule a follow-up appointment in 1 year. You will receive a reminder letter in the mail two months in advance. If you don't receive a letter, please call our office to schedule the follow-up appointment.  If you need a refill on your cardiac medications before your next appointment, please call your pharmacy.

## 2017-02-06 DIAGNOSIS — N889 Noninflammatory disorder of cervix uteri, unspecified: Secondary | ICD-10-CM | POA: Diagnosis not present

## 2017-02-06 DIAGNOSIS — N183 Chronic kidney disease, stage 3 (moderate): Secondary | ICD-10-CM | POA: Diagnosis not present

## 2017-02-06 DIAGNOSIS — N189 Chronic kidney disease, unspecified: Secondary | ICD-10-CM | POA: Diagnosis not present

## 2017-02-06 DIAGNOSIS — E889 Metabolic disorder, unspecified: Secondary | ICD-10-CM | POA: Diagnosis not present

## 2017-02-07 DIAGNOSIS — H401133 Primary open-angle glaucoma, bilateral, severe stage: Secondary | ICD-10-CM | POA: Diagnosis not present

## 2017-02-11 DIAGNOSIS — M5136 Other intervertebral disc degeneration, lumbar region: Secondary | ICD-10-CM | POA: Diagnosis not present

## 2017-02-11 DIAGNOSIS — M7062 Trochanteric bursitis, left hip: Secondary | ICD-10-CM | POA: Diagnosis not present

## 2017-02-11 DIAGNOSIS — M5416 Radiculopathy, lumbar region: Secondary | ICD-10-CM | POA: Diagnosis not present

## 2017-03-26 DIAGNOSIS — M7062 Trochanteric bursitis, left hip: Secondary | ICD-10-CM | POA: Diagnosis not present

## 2017-04-10 DIAGNOSIS — M4150 Other secondary scoliosis, site unspecified: Secondary | ICD-10-CM | POA: Diagnosis not present

## 2017-04-10 DIAGNOSIS — M5136 Other intervertebral disc degeneration, lumbar region: Secondary | ICD-10-CM | POA: Diagnosis not present

## 2017-04-10 DIAGNOSIS — M5410 Radiculopathy, site unspecified: Secondary | ICD-10-CM | POA: Diagnosis not present

## 2017-07-04 DIAGNOSIS — M4716 Other spondylosis with myelopathy, lumbar region: Secondary | ICD-10-CM | POA: Diagnosis not present

## 2017-07-04 DIAGNOSIS — M5410 Radiculopathy, site unspecified: Secondary | ICD-10-CM | POA: Diagnosis not present

## 2017-07-04 DIAGNOSIS — M4150 Other secondary scoliosis, site unspecified: Secondary | ICD-10-CM | POA: Diagnosis not present

## 2017-07-04 DIAGNOSIS — M5136 Other intervertebral disc degeneration, lumbar region: Secondary | ICD-10-CM | POA: Diagnosis not present

## 2017-07-11 DIAGNOSIS — H353132 Nonexudative age-related macular degeneration, bilateral, intermediate dry stage: Secondary | ICD-10-CM | POA: Diagnosis not present

## 2017-07-11 DIAGNOSIS — E119 Type 2 diabetes mellitus without complications: Secondary | ICD-10-CM | POA: Diagnosis not present

## 2017-07-11 DIAGNOSIS — H401133 Primary open-angle glaucoma, bilateral, severe stage: Secondary | ICD-10-CM | POA: Diagnosis not present

## 2017-07-11 DIAGNOSIS — Z961 Presence of intraocular lens: Secondary | ICD-10-CM | POA: Diagnosis not present

## 2017-07-11 DIAGNOSIS — M4716 Other spondylosis with myelopathy, lumbar region: Secondary | ICD-10-CM | POA: Diagnosis not present

## 2017-07-17 DIAGNOSIS — M5410 Radiculopathy, site unspecified: Secondary | ICD-10-CM | POA: Diagnosis not present

## 2017-07-17 DIAGNOSIS — M5136 Other intervertebral disc degeneration, lumbar region: Secondary | ICD-10-CM | POA: Diagnosis not present

## 2017-07-23 DIAGNOSIS — M25571 Pain in right ankle and joints of right foot: Secondary | ICD-10-CM | POA: Diagnosis not present

## 2017-07-23 DIAGNOSIS — M25561 Pain in right knee: Secondary | ICD-10-CM | POA: Diagnosis not present

## 2017-07-23 DIAGNOSIS — M25511 Pain in right shoulder: Secondary | ICD-10-CM | POA: Diagnosis not present

## 2017-08-05 DIAGNOSIS — M5410 Radiculopathy, site unspecified: Secondary | ICD-10-CM | POA: Diagnosis not present

## 2017-08-05 DIAGNOSIS — M5136 Other intervertebral disc degeneration, lumbar region: Secondary | ICD-10-CM | POA: Diagnosis not present

## 2017-09-03 DIAGNOSIS — M5442 Lumbago with sciatica, left side: Secondary | ICD-10-CM | POA: Diagnosis not present

## 2017-09-03 DIAGNOSIS — M5136 Other intervertebral disc degeneration, lumbar region: Secondary | ICD-10-CM | POA: Diagnosis not present

## 2017-09-03 DIAGNOSIS — M4150 Other secondary scoliosis, site unspecified: Secondary | ICD-10-CM | POA: Diagnosis not present

## 2017-09-03 DIAGNOSIS — M19011 Primary osteoarthritis, right shoulder: Secondary | ICD-10-CM | POA: Diagnosis not present

## 2017-09-10 ENCOUNTER — Telehealth: Payer: Self-pay | Admitting: Cardiovascular Disease

## 2017-09-10 DIAGNOSIS — M19011 Primary osteoarthritis, right shoulder: Secondary | ICD-10-CM | POA: Diagnosis not present

## 2017-09-10 NOTE — Telephone Encounter (Signed)
New message    1. What dental office are you calling from? Dr. Jolayne Panther  2. What is your office phone and fax number?    3. What type of procedure is the patient having performed? Cleaning today   4. What date is procedure scheduled or is the patient there now? Yes   5. What is your question (ex. Antibiotics prior to procedure, holding medication-we need to know how long dentist wants pt to hold med)? Does pt need premed?

## 2017-09-10 NOTE — Telephone Encounter (Signed)
Pt of [name] Dentist office calling to verify this patient (in dentist's office today for routine cleaning) does not need antibiotic prophylaxis.  Reviewed chart, prior echo report (2006), last OV assessment notes, based on this and pt's reported medical history, pt has no obvious indications  requiring antibiotic proph for dental cleaning.  Advised to defer to ADA guidelines, should be at dentist's discretion to give antibiotics, but do not see obvious cardiac indication to do so. Caller expressed understanding and thanks.

## 2017-09-10 NOTE — Telephone Encounter (Signed)
Agree, no CV indication for antibiotics [facility]

## 2017-09-24 DIAGNOSIS — M25531 Pain in right wrist: Secondary | ICD-10-CM | POA: Diagnosis not present

## 2017-09-24 DIAGNOSIS — M19011 Primary osteoarthritis, right shoulder: Secondary | ICD-10-CM | POA: Diagnosis not present

## 2017-09-24 DIAGNOSIS — Z23 Encounter for immunization: Secondary | ICD-10-CM | POA: Diagnosis not present

## 2017-09-29 DIAGNOSIS — E119 Type 2 diabetes mellitus without complications: Secondary | ICD-10-CM | POA: Diagnosis not present

## 2017-10-08 DIAGNOSIS — M1811 Unilateral primary osteoarthritis of first carpometacarpal joint, right hand: Secondary | ICD-10-CM | POA: Diagnosis not present

## 2017-10-08 DIAGNOSIS — M25531 Pain in right wrist: Secondary | ICD-10-CM | POA: Diagnosis not present

## 2017-10-08 DIAGNOSIS — M79644 Pain in right finger(s): Secondary | ICD-10-CM | POA: Diagnosis not present

## 2017-10-09 DIAGNOSIS — E782 Mixed hyperlipidemia: Secondary | ICD-10-CM | POA: Diagnosis not present

## 2017-10-09 DIAGNOSIS — Z0000 Encounter for general adult medical examination without abnormal findings: Secondary | ICD-10-CM | POA: Diagnosis not present

## 2017-10-09 DIAGNOSIS — E1121 Type 2 diabetes mellitus with diabetic nephropathy: Secondary | ICD-10-CM | POA: Diagnosis not present

## 2017-10-09 DIAGNOSIS — M810 Age-related osteoporosis without current pathological fracture: Secondary | ICD-10-CM | POA: Diagnosis not present

## 2017-10-09 DIAGNOSIS — I10 Essential (primary) hypertension: Secondary | ICD-10-CM | POA: Diagnosis not present

## 2017-10-14 DIAGNOSIS — D509 Iron deficiency anemia, unspecified: Secondary | ICD-10-CM | POA: Diagnosis not present

## 2017-10-14 DIAGNOSIS — I10 Essential (primary) hypertension: Secondary | ICD-10-CM | POA: Diagnosis not present

## 2017-10-14 DIAGNOSIS — I779 Disorder of arteries and arterioles, unspecified: Secondary | ICD-10-CM | POA: Diagnosis not present

## 2017-10-14 DIAGNOSIS — E1142 Type 2 diabetes mellitus with diabetic polyneuropathy: Secondary | ICD-10-CM | POA: Diagnosis not present

## 2017-10-14 DIAGNOSIS — M159 Polyosteoarthritis, unspecified: Secondary | ICD-10-CM | POA: Diagnosis not present

## 2017-10-14 DIAGNOSIS — E782 Mixed hyperlipidemia: Secondary | ICD-10-CM | POA: Diagnosis not present

## 2017-10-14 DIAGNOSIS — E1121 Type 2 diabetes mellitus with diabetic nephropathy: Secondary | ICD-10-CM | POA: Diagnosis not present

## 2017-10-14 DIAGNOSIS — M5136 Other intervertebral disc degeneration, lumbar region: Secondary | ICD-10-CM | POA: Diagnosis not present

## 2017-10-14 DIAGNOSIS — D352 Benign neoplasm of pituitary gland: Secondary | ICD-10-CM | POA: Diagnosis not present

## 2017-10-14 DIAGNOSIS — J449 Chronic obstructive pulmonary disease, unspecified: Secondary | ICD-10-CM | POA: Diagnosis not present

## 2017-10-14 DIAGNOSIS — M4800 Spinal stenosis, site unspecified: Secondary | ICD-10-CM | POA: Diagnosis not present

## 2017-10-14 DIAGNOSIS — N189 Chronic kidney disease, unspecified: Secondary | ICD-10-CM | POA: Diagnosis not present

## 2017-11-26 DIAGNOSIS — Z961 Presence of intraocular lens: Secondary | ICD-10-CM | POA: Diagnosis not present

## 2017-11-26 DIAGNOSIS — H401133 Primary open-angle glaucoma, bilateral, severe stage: Secondary | ICD-10-CM | POA: Diagnosis not present

## 2017-11-26 DIAGNOSIS — H04123 Dry eye syndrome of bilateral lacrimal glands: Secondary | ICD-10-CM | POA: Diagnosis not present

## 2017-11-26 DIAGNOSIS — H02052 Trichiasis without entropian right lower eyelid: Secondary | ICD-10-CM | POA: Diagnosis not present

## 2018-02-05 ENCOUNTER — Telehealth: Payer: Self-pay | Admitting: Cardiovascular Disease

## 2018-02-05 NOTE — Telephone Encounter (Signed)
Received call from patient's daughter [name].She stated mother has been having chest pain off and on for the past 3 days.She has took NTG x 2 this morning with no relief.She rates her pain # 6 at present.Advised to go to ED.She stated her mother refuses to go to ED.She scheduled appointment with Jory Sims DNP 02/10/18.Advised we do not have any sooner appointments.Advised she needs to go to ED.Message sent to Dr.Croitoru for review.

## 2018-02-05 NOTE — Telephone Encounter (Signed)
Pt c/o of Chest Pain: 1. Are you having CP right now? Yes-hurting in her chest-took 2 nitroglycerin 2. Are you experiencing any other symptoms (ex. SOB, nausea, vomiting, sweating)? 3. How long have you been experiencing CP?4. Is your CP continuous or coming and going? 5. Have you taken Nitroglycerin?

## 2018-02-05 NOTE — Telephone Encounter (Signed)
Returned call to patient's daughter [name].Dr.Croitoru's recommendations given.She will start taking aspirin 81 mg daily.

## 2018-02-05 NOTE — Telephone Encounter (Signed)
I would reinforce that she should go to the ED for persistent chest discomfort lasting >25 minutes and not relieved by SL NTG. Confirm she is taking ASA 81 mg daily please. MCr
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
## 2018-02-10 ENCOUNTER — Ambulatory Visit (INDEPENDENT_AMBULATORY_CARE_PROVIDER_SITE_OTHER): Payer: Medicare Other | Admitting: Adult Health

## 2018-02-10 ENCOUNTER — Encounter: Payer: Self-pay | Admitting: Adult Health

## 2018-02-10 VITALS — BP 134/68 | HR 76 | Ht 63.0 in | Wt 117.2 lb

## 2018-02-10 DIAGNOSIS — I2510 Atherosclerotic heart disease of native coronary artery without angina pectoris: Secondary | ICD-10-CM

## 2018-02-10 DIAGNOSIS — I6522 Occlusion and stenosis of left carotid artery: Secondary | ICD-10-CM

## 2018-02-10 DIAGNOSIS — I10 Essential (primary) hypertension: Secondary | ICD-10-CM

## 2018-02-10 MED ORDER — AMOXICILLIN 500 MG PO TABS: 1000.0000 mg | ORAL_TABLET | Freq: Once | ORAL | 0 refills | Status: AC

## 2018-02-10 MED ORDER — HYDROCHLOROTHIAZIDE 12.5 MG PO CAPS: 12.5000 mg | ORAL_CAPSULE | ORAL | 0 refills | Status: DC | PRN

## 2018-02-10 NOTE — Patient Instructions (Signed)
Medication Instructions:  TAKE HYDROCHLOROTHIAZIDE ONLY AS NEEDED If you need a refill on your cardiac medications before your next appointment, please call your pharmacy.  Special Instructions: TAKE 2-500MG  AMOX 1 HOUR BEFORE DENTAL PROCEDURE  Follow-Up: Your physician wants you to follow-up in: [place] should receive a reminder letter in the mail two months in advance. If you do not receive a letter, please call our office 05-2018 to schedule the 07-2018 follow-up appointment.   Thank you for choosing [facility] at [facility]!!

## 2018-02-11 DIAGNOSIS — E1121 Type 2 diabetes mellitus with diabetic nephropathy: Secondary | ICD-10-CM | POA: Diagnosis not present

## 2018-02-11 DIAGNOSIS — I10 Essential (primary) hypertension: Secondary | ICD-10-CM | POA: Diagnosis not present

## 2018-04-02 DIAGNOSIS — Z961 Presence of intraocular lens: Secondary | ICD-10-CM | POA: Diagnosis not present

## 2018-04-02 DIAGNOSIS — H401133 Primary open-angle glaucoma, bilateral, severe stage: Secondary | ICD-10-CM | POA: Diagnosis not present

## 2018-04-02 DIAGNOSIS — H04123 Dry eye syndrome of bilateral lacrimal glands: Secondary | ICD-10-CM | POA: Diagnosis not present

## 2018-04-17 DIAGNOSIS — N183 Chronic kidney disease, stage 3 (moderate): Secondary | ICD-10-CM | POA: Diagnosis not present

## 2018-04-23 DIAGNOSIS — I129 Hypertensive chronic kidney disease with stage 1 through stage 4 chronic kidney disease, or unspecified chronic kidney disease: Secondary | ICD-10-CM | POA: Diagnosis not present

## 2018-04-23 DIAGNOSIS — D631 Anemia in chronic kidney disease: Secondary | ICD-10-CM | POA: Diagnosis not present

## 2018-04-23 DIAGNOSIS — N183 Chronic kidney disease, stage 3 (moderate): Secondary | ICD-10-CM | POA: Diagnosis not present

## 2018-04-23 DIAGNOSIS — E889 Metabolic disorder, unspecified: Secondary | ICD-10-CM | POA: Diagnosis not present

## 2018-04-23 DIAGNOSIS — M9080 Osteopathy in diseases classified elsewhere, unspecified site: Secondary | ICD-10-CM | POA: Diagnosis not present

## 2018-06-19 DIAGNOSIS — Z961 Presence of intraocular lens: Secondary | ICD-10-CM | POA: Diagnosis not present

## 2018-06-19 DIAGNOSIS — H401133 Primary open-angle glaucoma, bilateral, severe stage: Secondary | ICD-10-CM | POA: Diagnosis not present

## 2018-06-27 ENCOUNTER — Other Ambulatory Visit: Payer: Self-pay

## 2018-06-27 ENCOUNTER — Encounter (HOSPITAL_BASED_OUTPATIENT_CLINIC_OR_DEPARTMENT_OTHER): Payer: Self-pay | Admitting: *Deleted

## 2018-06-27 ENCOUNTER — Emergency Department (HOSPITAL_BASED_OUTPATIENT_CLINIC_OR_DEPARTMENT_OTHER): Payer: Medicare Other

## 2018-06-27 ENCOUNTER — Emergency Department (HOSPITAL_BASED_OUTPATIENT_CLINIC_OR_DEPARTMENT_OTHER)
Admission: EM | Admit: 2018-06-27 | Discharge: 2018-06-27 | Disposition: A | Discharge status: Home / Self Care | Payer: Medicare Other | Attending: Emergency Medicine | Admitting: Emergency Medicine

## 2018-06-27 DIAGNOSIS — R600 Localized edema: Secondary | ICD-10-CM | POA: Diagnosis not present

## 2018-06-27 DIAGNOSIS — S299XXA Unspecified injury of thorax, initial encounter: Secondary | ICD-10-CM | POA: Diagnosis not present

## 2018-06-27 DIAGNOSIS — Z23 Encounter for immunization: Secondary | ICD-10-CM | POA: Diagnosis not present

## 2018-06-27 DIAGNOSIS — I129 Hypertensive chronic kidney disease with stage 1 through stage 4 chronic kidney disease, or unspecified chronic kidney disease: Secondary | ICD-10-CM | POA: Insufficient documentation

## 2018-06-27 DIAGNOSIS — Z85828 Personal history of other malignant neoplasm of skin: Secondary | ICD-10-CM | POA: Insufficient documentation

## 2018-06-27 DIAGNOSIS — I2510 Atherosclerotic heart disease of native coronary artery without angina pectoris: Secondary | ICD-10-CM | POA: Insufficient documentation

## 2018-06-27 DIAGNOSIS — R4020 Unspecified coma: Secondary | ICD-10-CM | POA: Diagnosis not present

## 2018-06-27 DIAGNOSIS — J449 Chronic obstructive pulmonary disease, unspecified: Secondary | ICD-10-CM | POA: Insufficient documentation

## 2018-06-27 DIAGNOSIS — Z79899 Other long term (current) drug therapy: Secondary | ICD-10-CM | POA: Insufficient documentation

## 2018-06-27 DIAGNOSIS — N183 Chronic kidney disease, stage 3 (moderate): Secondary | ICD-10-CM | POA: Insufficient documentation

## 2018-06-27 DIAGNOSIS — E1122 Type 2 diabetes mellitus with diabetic chronic kidney disease: Secondary | ICD-10-CM | POA: Insufficient documentation

## 2018-06-27 DIAGNOSIS — Y92007 Garden or yard of unspecified non-institutional (private) residence as the place of occurrence of the external cause: Secondary | ICD-10-CM | POA: Diagnosis not present

## 2018-06-27 DIAGNOSIS — Y9389 Activity, other specified: Secondary | ICD-10-CM | POA: Diagnosis not present

## 2018-06-27 DIAGNOSIS — Y998 Other external cause status: Secondary | ICD-10-CM | POA: Insufficient documentation

## 2018-06-27 DIAGNOSIS — S51012A Laceration without foreign body of left elbow, initial encounter: Secondary | ICD-10-CM | POA: Insufficient documentation

## 2018-06-27 DIAGNOSIS — Z87891 Personal history of nicotine dependence: Secondary | ICD-10-CM | POA: Diagnosis not present

## 2018-06-27 DIAGNOSIS — R55 Syncope and collapse: Secondary | ICD-10-CM | POA: Diagnosis not present

## 2018-06-27 DIAGNOSIS — R42 Dizziness and giddiness: Secondary | ICD-10-CM | POA: Diagnosis not present

## 2018-06-27 DIAGNOSIS — S5002XA Contusion of left elbow, initial encounter: Secondary | ICD-10-CM

## 2018-06-27 DIAGNOSIS — W01198A Fall on same level from slipping, tripping and stumbling with subsequent striking against other object, initial encounter: Secondary | ICD-10-CM | POA: Insufficient documentation

## 2018-06-27 DIAGNOSIS — Z7982 Long term (current) use of aspirin: Secondary | ICD-10-CM | POA: Diagnosis not present

## 2018-06-27 LAB — CBC WITH DIFFERENTIAL/PLATELET
Basophils Absolute: 0 10*3/uL (ref 0.0–0.1)
Basophils Relative: 0 %
Eosinophils Absolute: 0.2 10*3/uL (ref 0.0–0.7)
Eosinophils Relative: 3 %
HEMATOCRIT: 35 % — AB (ref 36.0–46.0)
HEMOGLOBIN: 11.3 g/dL — AB (ref 12.0–15.0)
LYMPHS ABS: 1.3 10*3/uL (ref 0.7–4.0)
LYMPHS PCT: 17 %
MCH: 29.7 pg (ref 26.0–34.0)
MCHC: 32.3 g/dL (ref 30.0–36.0)
MCV: 91.9 fL (ref 78.0–100.0)
MONOS PCT: 13 %
Monocytes Absolute: 1 10*3/uL (ref 0.1–1.0)
NEUTROS ABS: 5 10*3/uL (ref 1.7–7.7)
NEUTROS PCT: 67 %
Platelets: 217 10*3/uL (ref 150–400)
RBC: 3.81 MIL/uL — ABNORMAL LOW (ref 3.87–5.11)
RDW: 13.6 % (ref 11.5–15.5)
WBC: 7.6 10*3/uL (ref 4.0–10.5)

## 2018-06-27 LAB — COMPREHENSIVE METABOLIC PANEL
ALK PHOS: 20 U/L — AB (ref 38–126)
ALT: 8 U/L (ref 0–44)
ANION GAP: 11 (ref 5–15)
AST: 24 U/L (ref 15–41)
Albumin: 3.7 g/dL (ref 3.5–5.0)
BILIRUBIN TOTAL: 0.4 mg/dL (ref 0.3–1.2)
BUN: 24 mg/dL — ABNORMAL HIGH (ref 8–23)
CALCIUM: 9.6 mg/dL (ref 8.9–10.3)
CO2: 24 mmol/L (ref 22–32)
CREATININE: 1.05 mg/dL — AB (ref 0.44–1.00)
Chloride: 104 mmol/L (ref 98–111)
GFR calc non Af Amer: 44 mL/min — ABNORMAL LOW (ref >60)
GFR, EST AFRICAN AMERICAN: 51 mL/min — AB (ref >60)
Glucose, Bld: 204 mg/dL — ABNORMAL HIGH (ref 70–99)
Potassium: 3.8 mmol/L (ref 3.5–5.1)
Sodium: 139 mmol/L (ref 135–145)
TOTAL PROTEIN: 6.9 g/dL (ref 6.5–8.1)

## 2018-06-27 LAB — CBG MONITORING, ED: GLUCOSE-CAPILLARY: 194 mg/dL — AB (ref 70–99)

## 2018-06-27 LAB — TROPONIN I: Troponin I: <0.03 ng/mL (ref <0.03)

## 2018-06-27 MED ORDER — LIDOCAINE-EPINEPHRINE (PF) 2 %-1:200000 IJ SOLN
10.0000 mL | Freq: Once | INTRAMUSCULAR | Status: DC
  Filled 2018-06-27: qty 10

## 2018-06-27 MED ORDER — TETANUS-DIPHTH-ACELL PERTUSSIS 5-2.5-18.5 LF-MCG/0.5 IM SUSP
0.5000 mL | Freq: Once | INTRAMUSCULAR | Status: AC
  Administered 2018-06-27: 0.5 mL via INTRAMUSCULAR
  Filled 2018-06-27: qty 0.5

## 2018-06-27 MED ORDER — LIDOCAINE-EPINEPHRINE (PF) 2 %-1:200000 IJ SOLN
INTRAMUSCULAR | Status: AC
  Filled 2018-06-27: qty 10

## 2018-06-27 NOTE — ED Notes (Signed)
Pt lying flat for orthostatics 
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
## 2018-06-27 NOTE — ED Triage Notes (Signed)
Pt states "I think I passed out". Reports she fell outside and landed on left elbow. C/o laceration to left elbow and pain to palpation. Dressing not removed in triage

## 2018-06-27 NOTE — Discharge Instructions (Signed)
Take tylenol for pain   Continue tramadol for severe pain.   See your doctor in a week for suture removal   Return to ER if you have worse elbow bleeding, severe pain, passing out, chest pain

## 2018-06-27 NOTE — ED Notes (Signed)
Went to get pt for CT; pt still lying flat for orthostatics

## 2018-07-06 DIAGNOSIS — Z4802 Encounter for removal of sutures: Secondary | ICD-10-CM | POA: Diagnosis not present

## 2018-07-06 DIAGNOSIS — S51002D Unspecified open wound of left elbow, subsequent encounter: Secondary | ICD-10-CM | POA: Diagnosis not present

## 2018-07-06 DIAGNOSIS — R55 Syncope and collapse: Secondary | ICD-10-CM | POA: Diagnosis not present

## 2018-07-06 DIAGNOSIS — W19XXXD Unspecified fall, subsequent encounter: Secondary | ICD-10-CM | POA: Diagnosis not present

## 2018-07-20 DIAGNOSIS — R05 Cough: Secondary | ICD-10-CM | POA: Diagnosis not present

## 2018-07-20 DIAGNOSIS — R079 Chest pain, unspecified: Secondary | ICD-10-CM | POA: Diagnosis not present

## 2018-07-20 DIAGNOSIS — R071 Chest pain on breathing: Secondary | ICD-10-CM | POA: Diagnosis not present

## 2018-07-20 DIAGNOSIS — J159 Unspecified bacterial pneumonia: Secondary | ICD-10-CM | POA: Diagnosis not present

## 2018-07-29 DIAGNOSIS — R05 Cough: Secondary | ICD-10-CM | POA: Diagnosis not present

## 2018-07-29 DIAGNOSIS — J159 Unspecified bacterial pneumonia: Secondary | ICD-10-CM | POA: Diagnosis not present

## 2018-07-29 DIAGNOSIS — R071 Chest pain on breathing: Secondary | ICD-10-CM | POA: Diagnosis not present

## 2018-08-20 DIAGNOSIS — M4800 Spinal stenosis, site unspecified: Secondary | ICD-10-CM | POA: Diagnosis not present

## 2018-08-20 DIAGNOSIS — J159 Unspecified bacterial pneumonia: Secondary | ICD-10-CM | POA: Diagnosis not present

## 2018-10-06 DIAGNOSIS — H02052 Trichiasis without entropian right lower eyelid: Secondary | ICD-10-CM | POA: Diagnosis not present

## 2018-10-06 DIAGNOSIS — H04123 Dry eye syndrome of bilateral lacrimal glands: Secondary | ICD-10-CM | POA: Diagnosis not present

## 2018-10-06 DIAGNOSIS — H02055 Trichiasis without entropian left lower eyelid: Secondary | ICD-10-CM | POA: Diagnosis not present

## 2018-10-28 DIAGNOSIS — H401133 Primary open-angle glaucoma, bilateral, severe stage: Secondary | ICD-10-CM | POA: Diagnosis not present

## 2018-10-30 DIAGNOSIS — E1121 Type 2 diabetes mellitus with diabetic nephropathy: Secondary | ICD-10-CM | POA: Diagnosis not present

## 2018-10-30 DIAGNOSIS — I10 Essential (primary) hypertension: Secondary | ICD-10-CM | POA: Diagnosis not present

## 2018-10-30 DIAGNOSIS — E782 Mixed hyperlipidemia: Secondary | ICD-10-CM | POA: Diagnosis not present

## 2018-11-04 DIAGNOSIS — I6529 Occlusion and stenosis of unspecified carotid artery: Secondary | ICD-10-CM | POA: Diagnosis not present

## 2018-11-04 DIAGNOSIS — Z0000 Encounter for general adult medical examination without abnormal findings: Secondary | ICD-10-CM | POA: Diagnosis not present

## 2018-11-04 DIAGNOSIS — E1142 Type 2 diabetes mellitus with diabetic polyneuropathy: Secondary | ICD-10-CM | POA: Diagnosis not present

## 2018-11-04 DIAGNOSIS — J449 Chronic obstructive pulmonary disease, unspecified: Secondary | ICD-10-CM | POA: Diagnosis not present

## 2018-11-04 DIAGNOSIS — J309 Allergic rhinitis, unspecified: Secondary | ICD-10-CM | POA: Diagnosis not present

## 2018-11-04 DIAGNOSIS — I10 Essential (primary) hypertension: Secondary | ICD-10-CM | POA: Diagnosis not present

## 2018-11-04 DIAGNOSIS — M4120 Other idiopathic scoliosis, site unspecified: Secondary | ICD-10-CM | POA: Diagnosis not present

## 2018-11-04 DIAGNOSIS — I340 Nonrheumatic mitral (valve) insufficiency: Secondary | ICD-10-CM | POA: Diagnosis not present

## 2018-11-04 DIAGNOSIS — K589 Irritable bowel syndrome without diarrhea: Secondary | ICD-10-CM | POA: Diagnosis not present

## 2018-11-04 DIAGNOSIS — E782 Mixed hyperlipidemia: Secondary | ICD-10-CM | POA: Diagnosis not present

## 2018-11-04 DIAGNOSIS — M159 Polyosteoarthritis, unspecified: Secondary | ICD-10-CM | POA: Diagnosis not present

## 2018-11-04 DIAGNOSIS — E1121 Type 2 diabetes mellitus with diabetic nephropathy: Secondary | ICD-10-CM | POA: Diagnosis not present

## 2018-11-13 DIAGNOSIS — M8580 Other specified disorders of bone density and structure, unspecified site: Secondary | ICD-10-CM | POA: Diagnosis not present

## 2018-11-13 DIAGNOSIS — M8589 Other specified disorders of bone density and structure, multiple sites: Secondary | ICD-10-CM | POA: Diagnosis not present

## 2018-11-13 DIAGNOSIS — J449 Chronic obstructive pulmonary disease, unspecified: Secondary | ICD-10-CM | POA: Diagnosis not present

## 2018-11-13 DIAGNOSIS — M859 Disorder of bone density and structure, unspecified: Secondary | ICD-10-CM | POA: Diagnosis not present

## 2018-11-16 DIAGNOSIS — H353134 Nonexudative age-related macular degeneration, bilateral, advanced atrophic with subfoveal involvement: Secondary | ICD-10-CM | POA: Diagnosis not present

## 2018-11-16 DIAGNOSIS — H35433 Paving stone degeneration of retina, bilateral: Secondary | ICD-10-CM | POA: Diagnosis not present

## 2018-11-16 DIAGNOSIS — H31113 Age-related choroidal atrophy, bilateral: Secondary | ICD-10-CM | POA: Diagnosis not present

## 2018-11-16 DIAGNOSIS — H43813 Vitreous degeneration, bilateral: Secondary | ICD-10-CM | POA: Diagnosis not present

## 2018-12-02 DIAGNOSIS — D352 Benign neoplasm of pituitary gland: Secondary | ICD-10-CM | POA: Diagnosis not present

## 2018-12-02 DIAGNOSIS — Z6841 Body Mass Index (BMI) 40.0 and over, adult: Secondary | ICD-10-CM | POA: Diagnosis not present

## 2018-12-02 DIAGNOSIS — E119 Type 2 diabetes mellitus without complications: Secondary | ICD-10-CM | POA: Diagnosis not present

## 2018-12-11 DIAGNOSIS — H401133 Primary open-angle glaucoma, bilateral, severe stage: Secondary | ICD-10-CM | POA: Diagnosis not present

## 2018-12-11 DIAGNOSIS — H04123 Dry eye syndrome of bilateral lacrimal glands: Secondary | ICD-10-CM | POA: Diagnosis not present

## 2018-12-15 DIAGNOSIS — H04123 Dry eye syndrome of bilateral lacrimal glands: Secondary | ICD-10-CM | POA: Diagnosis not present

## 2018-12-15 DIAGNOSIS — H0288B Meibomian gland dysfunction left eye, upper and lower eyelids: Secondary | ICD-10-CM | POA: Diagnosis not present

## 2018-12-15 DIAGNOSIS — H0288A Meibomian gland dysfunction right eye, upper and lower eyelids: Secondary | ICD-10-CM | POA: Diagnosis not present

## 2018-12-25 DIAGNOSIS — J449 Chronic obstructive pulmonary disease, unspecified: Secondary | ICD-10-CM | POA: Diagnosis not present

## 2018-12-25 DIAGNOSIS — M8580 Other specified disorders of bone density and structure, unspecified site: Secondary | ICD-10-CM | POA: Diagnosis not present

## 2018-12-25 DIAGNOSIS — J40 Bronchitis, not specified as acute or chronic: Secondary | ICD-10-CM | POA: Diagnosis not present

## 2018-12-29 DIAGNOSIS — M19041 Primary osteoarthritis, right hand: Secondary | ICD-10-CM | POA: Diagnosis not present

## 2018-12-29 DIAGNOSIS — M65331 Trigger finger, right middle finger: Secondary | ICD-10-CM | POA: Diagnosis not present

## 2019-06-18 DIAGNOSIS — E119 Type 2 diabetes mellitus without complications: Secondary | ICD-10-CM | POA: Diagnosis not present

## 2019-06-18 DIAGNOSIS — H04123 Dry eye syndrome of bilateral lacrimal glands: Secondary | ICD-10-CM | POA: Diagnosis not present

## 2019-06-18 DIAGNOSIS — H401133 Primary open-angle glaucoma, bilateral, severe stage: Secondary | ICD-10-CM | POA: Diagnosis not present

## 2019-07-12 DIAGNOSIS — E119 Type 2 diabetes mellitus without complications: Secondary | ICD-10-CM | POA: Diagnosis not present

## 2019-11-23 DIAGNOSIS — Z23 Encounter for immunization: Secondary | ICD-10-CM | POA: Diagnosis not present

## 2019-12-20 DIAGNOSIS — H04123 Dry eye syndrome of bilateral lacrimal glands: Secondary | ICD-10-CM | POA: Diagnosis not present

## 2019-12-20 DIAGNOSIS — E119 Type 2 diabetes mellitus without complications: Secondary | ICD-10-CM | POA: Diagnosis not present

## 2019-12-20 DIAGNOSIS — H401133 Primary open-angle glaucoma, bilateral, severe stage: Secondary | ICD-10-CM | POA: Diagnosis not present

## 2020-01-20 DIAGNOSIS — D649 Anemia, unspecified: Secondary | ICD-10-CM | POA: Diagnosis not present

## 2020-01-20 DIAGNOSIS — N189 Chronic kidney disease, unspecified: Secondary | ICD-10-CM | POA: Diagnosis not present

## 2020-01-20 DIAGNOSIS — E782 Mixed hyperlipidemia: Secondary | ICD-10-CM | POA: Diagnosis not present

## 2020-01-20 DIAGNOSIS — E1142 Type 2 diabetes mellitus with diabetic polyneuropathy: Secondary | ICD-10-CM | POA: Diagnosis not present

## 2020-01-20 DIAGNOSIS — M26623 Arthralgia of bilateral temporomandibular joint: Secondary | ICD-10-CM | POA: Diagnosis not present

## 2020-02-14 DIAGNOSIS — Z0000 Encounter for general adult medical examination without abnormal findings: Secondary | ICD-10-CM | POA: Diagnosis not present

## 2020-02-14 DIAGNOSIS — E221 Hyperprolactinemia: Secondary | ICD-10-CM | POA: Diagnosis not present

## 2020-02-14 DIAGNOSIS — E1121 Type 2 diabetes mellitus with diabetic nephropathy: Secondary | ICD-10-CM | POA: Diagnosis not present

## 2020-02-14 DIAGNOSIS — R079 Chest pain, unspecified: Secondary | ICD-10-CM | POA: Diagnosis not present

## 2020-02-14 DIAGNOSIS — Z1212 Encounter for screening for malignant neoplasm of rectum: Secondary | ICD-10-CM | POA: Diagnosis not present

## 2020-02-14 DIAGNOSIS — Z01419 Encounter for gynecological examination (general) (routine) without abnormal findings: Secondary | ICD-10-CM | POA: Diagnosis not present

## 2020-02-14 DIAGNOSIS — M8580 Other specified disorders of bone density and structure, unspecified site: Secondary | ICD-10-CM | POA: Diagnosis not present

## 2020-02-14 DIAGNOSIS — E559 Vitamin D deficiency, unspecified: Secondary | ICD-10-CM | POA: Diagnosis not present

## 2020-02-14 DIAGNOSIS — I10 Essential (primary) hypertension: Secondary | ICD-10-CM | POA: Diagnosis not present

## 2020-02-14 DIAGNOSIS — D508 Other iron deficiency anemias: Secondary | ICD-10-CM | POA: Diagnosis not present
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
## 2020-02-15 ENCOUNTER — Encounter (INDEPENDENT_AMBULATORY_CARE_PROVIDER_SITE_OTHER): Payer: Self-pay

## 2020-02-15 ENCOUNTER — Ambulatory Visit (INDEPENDENT_AMBULATORY_CARE_PROVIDER_SITE_OTHER): Payer: Medicare Other | Admitting: Adult Health

## 2020-02-15 ENCOUNTER — Other Ambulatory Visit: Payer: Self-pay

## 2020-02-15 ENCOUNTER — Encounter: Payer: Self-pay | Admitting: Adult Health

## 2020-02-15 VITALS — BP 120/50 | HR 93 | Temp 97.0°F | Ht 63.0 in | Wt 110.0 lb

## 2020-02-15 DIAGNOSIS — I10 Essential (primary) hypertension: Secondary | ICD-10-CM

## 2020-02-15 DIAGNOSIS — I6320 Cerebral infarction due to unspecified occlusion or stenosis of unspecified precerebral arteries: Secondary | ICD-10-CM | POA: Diagnosis not present

## 2020-02-15 DIAGNOSIS — G8929 Other chronic pain: Secondary | ICD-10-CM | POA: Diagnosis not present

## 2020-02-15 DIAGNOSIS — I6523 Occlusion and stenosis of bilateral carotid arteries: Secondary | ICD-10-CM

## 2020-02-15 DIAGNOSIS — R079 Chest pain, unspecified: Secondary | ICD-10-CM

## 2020-02-15 NOTE — Patient Instructions (Signed)
Medication Instructions:  STOP- Hydrochlorothiazide(HCTz)  *If you need a refill on your cardiac medications before your next appointment, please call your pharmacy*   Lab Work: None Ordered  Testing/Procedures: None Ordered   Follow-Up: At [facility], you and your health needs are our priority.  As part of our continuing mission to provide you with exceptional heart care, we have created designated Provider Care Teams.  These Care Teams include your primary Cardiologist (physician) and Advanced Practice Providers (APPs -  Physician Assistants and Nurse Practitioners) who all work together to provide you with the care you need, when you need it.  We recommend signing up for the patient portal called "MyChart".  Sign up information is provided on this After Visit Summary.  MyChart is used to connect with patients for Virtual Visits (Telemedicine).  Patients are able to view lab/test results, encounter notes, upcoming appointments, etc.  Non-urgent messages can be sent to your provider as well.   To learn more about what you can do with MyChart, go to [email or web address].    Your next appointment:   6 month(s)  The format for your next appointment:   In Person  Provider:   You may see Dr Sallyanne Kuster or one of the following Advanced Practice Providers on your designated Care Team:    Almyra Deforest, PA-C  Fabian Sharp, PA-C or   Roby Lofts, [place]

## 2020-02-16 NOTE — Progress Notes (Signed)
Thanks, agree with stopping HCTZ. If DBP is still < 55 mm Hg after 2 weeks, would also reduce the amlodipine.

## 2020-03-09 DIAGNOSIS — D3709 Neoplasm of uncertain behavior of other specified sites of the oral cavity: Secondary | ICD-10-CM | POA: Diagnosis not present

## 2020-06-19 DIAGNOSIS — E119 Type 2 diabetes mellitus without complications: Secondary | ICD-10-CM | POA: Diagnosis not present

## 2020-06-19 DIAGNOSIS — H401133 Primary open-angle glaucoma, bilateral, severe stage: Secondary | ICD-10-CM | POA: Diagnosis not present

## 2020-06-19 DIAGNOSIS — H04123 Dry eye syndrome of bilateral lacrimal glands: Secondary | ICD-10-CM | POA: Diagnosis not present

## 2020-08-07 DIAGNOSIS — H401133 Primary open-angle glaucoma, bilateral, severe stage: Secondary | ICD-10-CM | POA: Diagnosis not present

## 2020-08-07 DIAGNOSIS — E119 Type 2 diabetes mellitus without complications: Secondary | ICD-10-CM | POA: Diagnosis not present

## 2020-08-07 DIAGNOSIS — H04123 Dry eye syndrome of bilateral lacrimal glands: Secondary | ICD-10-CM | POA: Diagnosis not present

## 2020-08-11 DIAGNOSIS — E1142 Type 2 diabetes mellitus with diabetic polyneuropathy: Secondary | ICD-10-CM | POA: Diagnosis not present

## 2020-08-11 DIAGNOSIS — Z79899 Other long term (current) drug therapy: Secondary | ICD-10-CM | POA: Diagnosis not present

## 2020-08-11 DIAGNOSIS — Z23 Encounter for immunization: Secondary | ICD-10-CM | POA: Diagnosis not present

## 2020-08-25 DIAGNOSIS — G4700 Insomnia, unspecified: Secondary | ICD-10-CM | POA: Diagnosis not present

## 2020-08-25 DIAGNOSIS — Z79899 Other long term (current) drug therapy: Secondary | ICD-10-CM | POA: Diagnosis not present

## 2020-08-25 DIAGNOSIS — N189 Chronic kidney disease, unspecified: Secondary | ICD-10-CM | POA: Diagnosis not present

## 2020-09-20 DIAGNOSIS — N183 Chronic kidney disease, stage 3 unspecified: Secondary | ICD-10-CM | POA: Diagnosis not present

## 2020-09-25 DIAGNOSIS — E889 Metabolic disorder, unspecified: Secondary | ICD-10-CM | POA: Diagnosis not present

## 2020-09-25 DIAGNOSIS — N1831 Chronic kidney disease, stage 3a: Secondary | ICD-10-CM | POA: Diagnosis not present

## 2020-09-25 DIAGNOSIS — M9080 Osteopathy in diseases classified elsewhere, unspecified site: Secondary | ICD-10-CM | POA: Diagnosis not present

## 2020-09-25 DIAGNOSIS — D631 Anemia in chronic kidney disease: Secondary | ICD-10-CM | POA: Diagnosis not present

## 2020-09-25 DIAGNOSIS — N189 Chronic kidney disease, unspecified: Secondary | ICD-10-CM | POA: Diagnosis not present

## 2020-09-25 DIAGNOSIS — I129 Hypertensive chronic kidney disease with stage 1 through stage 4 chronic kidney disease, or unspecified chronic kidney disease: Secondary | ICD-10-CM | POA: Diagnosis not present

## 2020-09-28 DIAGNOSIS — N189 Chronic kidney disease, unspecified: Secondary | ICD-10-CM | POA: Diagnosis not present

## 2020-10-02 DIAGNOSIS — H401133 Primary open-angle glaucoma, bilateral, severe stage: Secondary | ICD-10-CM | POA: Diagnosis not present

## 2020-10-04 ENCOUNTER — Other Ambulatory Visit (HOSPITAL_COMMUNITY): Payer: Self-pay | Admitting: *Deleted

## 2020-10-04 NOTE — Discharge Instructions (Signed)
Ferumoxytol injection What is this medicine? FERUMOXYTOL is an iron complex. Iron is used to make healthy red blood cells, which carry oxygen and nutrients throughout the body. This medicine is used to treat iron deficiency anemia. This medicine may be used for other purposes; ask your health care provider or pharmacist if you have questions. COMMON BRAND NAME(S): Feraheme What should I tell my health care provider before I take this medicine? They need to know if you have any of these conditions:  anemia not caused by low iron levels  high levels of iron in the blood  magnetic resonance imaging (MRI) test scheduled  an unusual or allergic reaction to iron, other medicines, foods, dyes, or preservatives  pregnant or trying to get pregnant  breast-feeding How should I use this medicine? This medicine is for injection into a vein. It is given by a health care professional in a hospital or clinic setting. Talk to your pediatrician regarding the use of this medicine in children. Special care may be needed. Overdosage: If you think you have taken too much of this medicine contact a poison control center or emergency room at once. NOTE: This medicine is only for you. Do not share this medicine with others. What if I miss a dose? It is important not to miss your dose. Call your doctor or health care professional if you are unable to keep an appointment. What may interact with this medicine? This medicine may interact with the following medications:  other iron products This list may not describe all possible interactions. Give your health care provider a list of all the medicines, herbs, non-prescription drugs, or dietary supplements you use. Also tell them if you smoke, drink alcohol, or use illegal drugs. Some items may interact with your medicine. What should I watch for while using this medicine? Visit your doctor or healthcare professional regularly. Tell your doctor or healthcare  professional if your symptoms do not start to get better or if they get worse. You may need blood work done while you are taking this medicine. You may need to follow a special diet. Talk to your doctor. Foods that contain iron include: whole grains/cereals, dried fruits, beans, or peas, leafy green vegetables, and organ meats (liver, kidney). What side effects may I notice from receiving this medicine? Side effects that you should report to your doctor or health care professional as soon as possible:  allergic reactions like skin rash, itching or hives, swelling of the face, lips, or tongue  breathing problems  changes in blood pressure  feeling faint or lightheaded, falls  fever or chills  flushing, sweating, or hot feelings  swelling of the ankles or feet Side effects that usually do not require medical attention (report to your doctor or health care professional if they continue or are bothersome):  diarrhea  headache  nausea, vomiting  stomach pain This list may not describe all possible side effects. Call your doctor for medical advice about side effects. You may report side effects to FDA at 1-800-FDA-1088. Where should I keep my medicine? This drug is given in a hospital or clinic and will not be stored at home. NOTE: This sheet is a summary. It may not cover all possible information. If you have questions about this medicine, talk to your doctor, pharmacist, or health care provider.  2020 Elsevier/Gold Standard (2016-12-30 20:21:10) Epoetin Alfa injection What is this medicine? EPOETIN ALFA (e POE e tin AL fa) helps your body make more red blood cells. This  medicine is used to treat anemia caused by chronic kidney disease, cancer chemotherapy, or HIV-therapy. It may also be used before surgery if you have anemia. This medicine may be used for other purposes; ask your health care provider or pharmacist if you have questions. COMMON BRAND NAME(S): Epogen, Procrit,  Retacrit What should I tell my health care provider before I take this medicine? They need to know if you have any of these conditions:  cancer  heart disease  high blood pressure  history of blood clots  history of stroke  low levels of folate, iron, or vitamin B12 in the blood  seizures  an unusual or allergic reaction to erythropoietin, albumin, benzyl alcohol, hamster proteins, other medicines, foods, dyes, or preservatives  pregnant or trying to get pregnant  breast-feeding How should I use this medicine? This medicine is for injection into a vein or under the skin. It is usually given by a health care professional in a hospital or clinic setting. If you get this medicine at home, you will be taught how to prepare and give this medicine. Use exactly as directed. Take your medicine at regular intervals. Do not take your medicine more often than directed. It is important that you put your used needles and syringes in a special sharps container. Do not put them in a trash can. If you do not have a sharps container, call your pharmacist or healthcare provider to get one. A special MedGuide will be given to you by the pharmacist with each prescription and refill. Be sure to read this information carefully each time. Talk to your pediatrician regarding the use of this medicine in children. While this drug may be prescribed for selected conditions, precautions do apply. Overdosage: If you think you have taken too much of this medicine contact a poison control center or emergency room at once. NOTE: This medicine is only for you. Do not share this medicine with others. What if I miss a dose? If you miss a dose, take it as soon as you can. If it is almost time for your next dose, take only that dose. Do not take double or extra doses. What may interact with this medicine? Interactions have not been studied. This list may not describe all possible interactions. Give your health care  provider a list of all the medicines, herbs, non-prescription drugs, or dietary supplements you use. Also tell them if you smoke, drink alcohol, or use illegal drugs. Some items may interact with your medicine. What should I watch for while using this medicine? Your condition will be monitored carefully while you are receiving this medicine. You may need blood work done while you are taking this medicine. This medicine may cause a decrease in vitamin B6. You should make sure that you get enough vitamin B6 while you are taking this medicine. Discuss the foods you eat and the vitamins you take with your health care professional. What side effects may I notice from receiving this medicine? Side effects that you should report to your doctor or health care professional as soon as possible:  allergic reactions like skin rash, itching or hives, swelling of the face, lips, or tongue  seizures  signs and symptoms of a blood clot such as breathing problems; changes in vision; chest pain; severe, sudden headache; pain, swelling, warmth in the leg; trouble speaking; sudden numbness or weakness of the face, arm or leg  signs and symptoms of a stroke like changes in vision; confusion; trouble speaking or understanding;   severe headaches; sudden numbness or weakness of the face, arm or leg; trouble walking; dizziness; loss of balance or coordination Side effects that usually do not require medical attention (report to your doctor or health care professional if they continue or are bothersome):  chills  cough  dizziness  fever  headaches  joint pain  muscle cramps  muscle pain  nausea, vomiting  pain, redness, or irritation at site where injected This list may not describe all possible side effects. Call your doctor for medical advice about side effects. You may report side effects to FDA at 1-800-FDA-1088. Where should I keep my medicine? Keep out of the reach of children. Store in a  refrigerator between 2 and 8 degrees C (36 and 46 degrees F). Do not freeze or shake. Throw away any unused portion if using a single-dose vial. Multi-dose vials can be kept in the refrigerator for up to 21 days after the initial dose. Throw away unused medicine. NOTE: This sheet is a summary. It may not cover all possible information. If you have questions about this medicine, talk to your doctor, pharmacist, or health care provider.  2020 Elsevier/Gold Standard (2017-06-20 08:35:19)

## 2020-10-05 ENCOUNTER — Encounter (HOSPITAL_COMMUNITY)
Admission: RE | Admit: 2020-10-05 | Discharge: 2020-10-05 | Disposition: A | Discharge status: Home / Self Care | Payer: Medicare Other | Source: Ambulatory Visit | Attending: Nephrology | Admitting: Nephrology

## 2020-10-05 VITALS — BP 149/50 | HR 71 | Temp 98.1°F | Resp 18

## 2020-10-05 DIAGNOSIS — N183 Chronic kidney disease, stage 3 unspecified: Secondary | ICD-10-CM | POA: Insufficient documentation

## 2020-10-05 DIAGNOSIS — Z23 Encounter for immunization: Secondary | ICD-10-CM | POA: Diagnosis not present

## 2020-10-05 LAB — POCT HEMOGLOBIN-HEMACUE: Hemoglobin: 9.5 g/dL — ABNORMAL LOW (ref 12.0–15.0)

## 2020-10-05 MED ORDER — SODIUM CHLORIDE 0.9 % IV SOLN
510.0000 mg | INTRAVENOUS | Status: DC
  Administered 2020-10-05: 510 mg via INTRAVENOUS
  Filled 2020-10-05: qty 17

## 2020-10-05 MED ORDER — EPOETIN ALFA-EPBX 10000 UNIT/ML IJ SOLN: 10000.0000 [IU] | INTRAMUSCULAR | Status: DC

## 2020-10-05 MED ORDER — EPOETIN ALFA-EPBX 10000 UNIT/ML IJ SOLN
INTRAMUSCULAR | Status: AC
  Administered 2020-10-05: 10000 [IU] via SUBCUTANEOUS
  Filled 2020-10-05: qty 1

## 2020-10-06 ENCOUNTER — Encounter: Payer: Self-pay | Admitting: Cardiovascular Disease

## 2020-10-06 ENCOUNTER — Ambulatory Visit (INDEPENDENT_AMBULATORY_CARE_PROVIDER_SITE_OTHER): Payer: Medicare Other | Admitting: Cardiovascular Disease

## 2020-10-06 ENCOUNTER — Other Ambulatory Visit: Payer: Self-pay

## 2020-10-06 VITALS — BP 138/56 | HR 83 | Ht 63.0 in | Wt 100.8 lb

## 2020-10-06 DIAGNOSIS — I10 Essential (primary) hypertension: Secondary | ICD-10-CM

## 2020-10-06 DIAGNOSIS — I208 Other forms of angina pectoris: Secondary | ICD-10-CM

## 2020-10-06 DIAGNOSIS — I6522 Occlusion and stenosis of left carotid artery: Secondary | ICD-10-CM | POA: Diagnosis not present

## 2020-10-06 MED ORDER — AMLODIPINE BESYLATE 2.5 MG PO TABS
2.5000 mg | ORAL_TABLET | Freq: Every day | ORAL | 5 refills | Status: DC
Start: 2020-10-06 — End: 2021-02-07
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
## 2020-10-06 NOTE — Patient Instructions (Signed)
Medication Instructions:  DECREASE the Amlodipine to 2.5 mg once daily  *If you need a refill on your cardiac medications before your next appointment, please call your pharmacy*   Lab Work: None ordered If you have labs (blood work) drawn today and your tests are completely normal, you will receive your results only by: [place] [facility] MyChart Message (if you have MyChart) OR . A paper copy in the mail If you have any lab test that is abnormal or we need to change your treatment, we will call you to review the results.   Testing/Procedures: None ordered   Follow-Up: At [facility], you and your health needs are our priority.  As part of our continuing mission to provide you with exceptional heart care, we have created designated Provider Care Teams.  These Care Teams include your primary Cardiologist (physician) and Advanced Practice Providers (APPs -  Physician Assistants and Nurse Practitioners) who all work together to provide you with the care you need, when you need it.  We recommend signing up for the patient portal called "MyChart".  Sign up information is provided on this After Visit Summary.  MyChart is used to connect with patients for Virtual Visits (Telemedicine).  Patients are able to view lab/test results, encounter notes, upcoming appointments, etc.  Non-urgent messages can be sent to your provider as well.   To learn more about what you can do with MyChart, go to [email or web address].    Your next appointment:   6 month(s)  The format for your next appointment:   In Person  Provider:   You may see Sanda Klein, MD or one of the following Advanced Practice Providers on your designated Care Team:    Almyra Deforest, PA-C  Fabian Sharp, PA-C or   Roby Lofts, [place]

## 2020-10-20 ENCOUNTER — Encounter (HOSPITAL_COMMUNITY): Payer: Medicare Other

## 2020-10-23 ENCOUNTER — Encounter (HOSPITAL_COMMUNITY)
Admission: RE | Admit: 2020-10-23 | Discharge: 2020-10-23 | Disposition: A | Discharge status: Home / Self Care | Payer: Medicare Other | Source: Ambulatory Visit | Attending: Nephrology | Admitting: Nephrology

## 2020-10-23 ENCOUNTER — Other Ambulatory Visit: Payer: Self-pay

## 2020-10-23 VITALS — BP 130/53 | HR 65 | Temp 97.1°F | Resp 20

## 2020-10-23 DIAGNOSIS — N183 Chronic kidney disease, stage 3 unspecified: Secondary | ICD-10-CM | POA: Diagnosis not present

## 2020-10-23 LAB — POCT HEMOGLOBIN-HEMACUE: Hemoglobin: 10.7 g/dL — ABNORMAL LOW (ref 12.0–15.0)

## 2020-10-23 MED ORDER — EPOETIN ALFA-EPBX 10000 UNIT/ML IJ SOLN: 10000.0000 [IU] | INTRAMUSCULAR | Status: DC

## 2020-10-23 MED ORDER — EPOETIN ALFA-EPBX 10000 UNIT/ML IJ SOLN
INTRAMUSCULAR | Status: AC
  Administered 2020-10-23: 10000 [IU] via SUBCUTANEOUS
  Filled 2020-10-23: qty 1

## 2020-10-23 MED ORDER — SODIUM CHLORIDE 0.9 % IV SOLN
510.0000 mg | INTRAVENOUS | Status: DC
  Administered 2020-10-23: 510 mg via INTRAVENOUS
  Filled 2020-10-23: qty 510

## 2020-11-06 ENCOUNTER — Encounter (HOSPITAL_COMMUNITY)
Admission: RE | Admit: 2020-11-06 | Discharge: 2020-11-06 | Disposition: A | Discharge status: Home / Self Care | Payer: Medicare Other | Source: Ambulatory Visit | Attending: Nephrology | Admitting: Nephrology

## 2020-11-06 ENCOUNTER — Other Ambulatory Visit: Payer: Self-pay

## 2020-11-06 VITALS — BP 148/57 | HR 63 | Temp 98.2°F | Resp 20

## 2020-11-06 DIAGNOSIS — D631 Anemia in chronic kidney disease: Secondary | ICD-10-CM | POA: Diagnosis not present

## 2020-11-06 DIAGNOSIS — N183 Chronic kidney disease, stage 3 unspecified: Secondary | ICD-10-CM | POA: Diagnosis not present

## 2020-11-06 LAB — CBC WITH DIFFERENTIAL/PLATELET
Abs Immature Granulocytes: 0.04 10*3/uL (ref 0.00–0.07)
Basophils Absolute: 0.1 10*3/uL (ref 0.0–0.1)
Basophils Relative: 1 %
Eosinophils Absolute: 0.2 10*3/uL (ref 0.0–0.5)
Eosinophils Relative: 2 %
HCT: 36 % (ref 36.0–46.0)
Hemoglobin: 10.8 g/dL — ABNORMAL LOW (ref 12.0–15.0)
Immature Granulocytes: 0 %
Lymphocytes Relative: 9 %
Lymphs Abs: 0.8 10*3/uL (ref 0.7–4.0)
MCH: 28.9 pg (ref 26.0–34.0)
MCHC: 30 g/dL (ref 30.0–36.0)
MCV: 96.3 fL (ref 80.0–100.0)
Monocytes Absolute: 1 10*3/uL (ref 0.1–1.0)
Monocytes Relative: 11 %
Neutro Abs: 7.2 10*3/uL (ref 1.7–7.7)
Neutrophils Relative %: 77 %
Platelets: 221 10*3/uL (ref 150–400)
RBC: 3.74 MIL/uL — ABNORMAL LOW (ref 3.87–5.11)
RDW: 16.6 % — ABNORMAL HIGH (ref 11.5–15.5)
WBC: 9.4 10*3/uL (ref 4.0–10.5)
nRBC: 0 % (ref 0.0–0.2)

## 2020-11-06 LAB — IRON AND TIBC
Iron: 55 ug/dL (ref 28–170)
Saturation Ratios: 25 % (ref 10.4–31.8)
TIBC: 220 ug/dL — ABNORMAL LOW (ref 250–450)
UIBC: 165 ug/dL

## 2020-11-06 LAB — FERRITIN: Ferritin: 569 ng/mL — ABNORMAL HIGH (ref 11–307)

## 2020-11-06 LAB — POCT HEMOGLOBIN-HEMACUE: Hemoglobin: 11.2 g/dL — ABNORMAL LOW (ref 12.0–15.0)

## 2020-11-06 MED ORDER — EPOETIN ALFA-EPBX 10000 UNIT/ML IJ SOLN
INTRAMUSCULAR | Status: AC
  Administered 2020-11-06: 11:00:00 10000 [IU] via SUBCUTANEOUS
  Filled 2020-11-06: qty 1

## 2020-11-06 MED ORDER — EPOETIN ALFA-EPBX 10000 UNIT/ML IJ SOLN: 10000.0000 [IU] | INTRAMUSCULAR | Status: DC

## 2020-11-06 NOTE — Discharge Instructions (Signed)

## 2020-11-20 ENCOUNTER — Encounter (HOSPITAL_COMMUNITY): Payer: Medicare Other

## 2021-01-12 DIAGNOSIS — N1831 Chronic kidney disease, stage 3a: Secondary | ICD-10-CM | POA: Diagnosis not present

## 2021-01-16 DIAGNOSIS — R079 Chest pain, unspecified: Secondary | ICD-10-CM | POA: Diagnosis not present

## 2021-01-16 DIAGNOSIS — J069 Acute upper respiratory infection, unspecified: Secondary | ICD-10-CM | POA: Diagnosis not present

## 2021-02-01 ENCOUNTER — Emergency Department (HOSPITAL_BASED_OUTPATIENT_CLINIC_OR_DEPARTMENT_OTHER)
Admission: EM | Admit: 2021-02-01 | Discharge: 2021-02-01 | Disposition: A | Discharge status: Home / Self Care | Payer: Medicare Other | Attending: Emergency Medicine | Admitting: Emergency Medicine

## 2021-02-01 ENCOUNTER — Other Ambulatory Visit: Payer: Self-pay

## 2021-02-01 ENCOUNTER — Emergency Department (HOSPITAL_BASED_OUTPATIENT_CLINIC_OR_DEPARTMENT_OTHER): Payer: Medicare Other

## 2021-02-01 ENCOUNTER — Encounter (HOSPITAL_BASED_OUTPATIENT_CLINIC_OR_DEPARTMENT_OTHER): Payer: Self-pay | Admitting: Emergency Medicine

## 2021-02-01 DIAGNOSIS — R1030 Lower abdominal pain, unspecified: Secondary | ICD-10-CM | POA: Insufficient documentation

## 2021-02-01 DIAGNOSIS — Z79899 Other long term (current) drug therapy: Secondary | ICD-10-CM | POA: Diagnosis not present

## 2021-02-01 DIAGNOSIS — J449 Chronic obstructive pulmonary disease, unspecified: Secondary | ICD-10-CM | POA: Insufficient documentation

## 2021-02-01 DIAGNOSIS — R102 Pelvic and perineal pain: Secondary | ICD-10-CM | POA: Diagnosis not present

## 2021-02-01 DIAGNOSIS — R197 Diarrhea, unspecified: Secondary | ICD-10-CM | POA: Diagnosis not present

## 2021-02-01 DIAGNOSIS — K5750 Diverticulosis of both small and large intestine without perforation or abscess without bleeding: Secondary | ICD-10-CM | POA: Diagnosis not present

## 2021-02-01 DIAGNOSIS — E1122 Type 2 diabetes mellitus with diabetic chronic kidney disease: Secondary | ICD-10-CM | POA: Diagnosis not present

## 2021-02-01 DIAGNOSIS — K6389 Other specified diseases of intestine: Secondary | ICD-10-CM

## 2021-02-01 DIAGNOSIS — R918 Other nonspecific abnormal finding of lung field: Secondary | ICD-10-CM | POA: Diagnosis not present

## 2021-02-01 DIAGNOSIS — I129 Hypertensive chronic kidney disease with stage 1 through stage 4 chronic kidney disease, or unspecified chronic kidney disease: Secondary | ICD-10-CM | POA: Diagnosis not present

## 2021-02-01 DIAGNOSIS — R55 Syncope and collapse: Secondary | ICD-10-CM | POA: Diagnosis not present

## 2021-02-01 DIAGNOSIS — R531 Weakness: Secondary | ICD-10-CM | POA: Insufficient documentation

## 2021-02-01 DIAGNOSIS — Z7951 Long term (current) use of inhaled steroids: Secondary | ICD-10-CM | POA: Insufficient documentation

## 2021-02-01 DIAGNOSIS — E1142 Type 2 diabetes mellitus with diabetic polyneuropathy: Secondary | ICD-10-CM | POA: Diagnosis not present

## 2021-02-01 DIAGNOSIS — D631 Anemia in chronic kidney disease: Secondary | ICD-10-CM | POA: Diagnosis not present

## 2021-02-01 DIAGNOSIS — Z85828 Personal history of other malignant neoplasm of skin: Secondary | ICD-10-CM | POA: Insufficient documentation

## 2021-02-01 DIAGNOSIS — Z87891 Personal history of nicotine dependence: Secondary | ICD-10-CM | POA: Insufficient documentation

## 2021-02-01 DIAGNOSIS — N183 Chronic kidney disease, stage 3 unspecified: Secondary | ICD-10-CM | POA: Diagnosis not present

## 2021-02-01 DIAGNOSIS — M47816 Spondylosis without myelopathy or radiculopathy, lumbar region: Secondary | ICD-10-CM | POA: Diagnosis not present

## 2021-02-01 DIAGNOSIS — M25552 Pain in left hip: Secondary | ICD-10-CM | POA: Insufficient documentation

## 2021-02-01 DIAGNOSIS — K449 Diaphragmatic hernia without obstruction or gangrene: Secondary | ICD-10-CM | POA: Diagnosis not present

## 2021-02-01 DIAGNOSIS — M1612 Unilateral primary osteoarthritis, left hip: Secondary | ICD-10-CM | POA: Diagnosis not present

## 2021-02-01 DIAGNOSIS — W19XXXA Unspecified fall, initial encounter: Secondary | ICD-10-CM

## 2021-02-01 DIAGNOSIS — K561 Intussusception: Secondary | ICD-10-CM | POA: Diagnosis not present

## 2021-02-01 DIAGNOSIS — R262 Difficulty in walking, not elsewhere classified: Secondary | ICD-10-CM | POA: Diagnosis not present

## 2021-02-01 LAB — CBC WITH DIFFERENTIAL/PLATELET
Abs Immature Granulocytes: 0.07 10*3/uL (ref 0.00–0.07)
Basophils Absolute: 0.1 10*3/uL (ref 0.0–0.1)
Basophils Relative: 1 %
Eosinophils Absolute: 0.5 10*3/uL (ref 0.0–0.5)
Eosinophils Relative: 6 %
HCT: 35.6 % — ABNORMAL LOW (ref 36.0–46.0)
Hemoglobin: 11.1 g/dL — ABNORMAL LOW (ref 12.0–15.0)
Immature Granulocytes: 1 %
Lymphocytes Relative: 9 %
Lymphs Abs: 0.8 10*3/uL (ref 0.7–4.0)
MCH: 30.7 pg (ref 26.0–34.0)
MCHC: 31.2 g/dL (ref 30.0–36.0)
MCV: 98.3 fL (ref 80.0–100.0)
Monocytes Absolute: 0.9 10*3/uL (ref 0.1–1.0)
Monocytes Relative: 11 %
Neutro Abs: 6.5 10*3/uL (ref 1.7–7.7)
Neutrophils Relative %: 72 %
Platelets: 285 10*3/uL (ref 150–400)
RBC: 3.62 MIL/uL — ABNORMAL LOW (ref 3.87–5.11)
RDW: 14.1 % (ref 11.5–15.5)
WBC: 8.8 10*3/uL (ref 4.0–10.5)
nRBC: 0 % (ref 0.0–0.2)

## 2021-02-01 LAB — URINALYSIS, ROUTINE W REFLEX MICROSCOPIC
Bilirubin Urine: NEGATIVE
Glucose, UA: NEGATIVE mg/dL
Hgb urine dipstick: NEGATIVE
Ketones, ur: NEGATIVE mg/dL
Leukocytes,Ua: NEGATIVE
Nitrite: NEGATIVE
Protein, ur: NEGATIVE mg/dL
Specific Gravity, Urine: 1.01 (ref 1.005–1.030)
pH: 7.5 (ref 5.0–8.0)

## 2021-02-01 LAB — COMPREHENSIVE METABOLIC PANEL
ALT: 10 U/L (ref 0–44)
AST: 21 U/L (ref 15–41)
Albumin: 3.1 g/dL — ABNORMAL LOW (ref 3.5–5.0)
Alkaline Phosphatase: 24 U/L — ABNORMAL LOW (ref 38–126)
Anion gap: 11 (ref 5–15)
BUN: 31 mg/dL — ABNORMAL HIGH (ref 8–23)
CO2: 31 mmol/L (ref 22–32)
Calcium: 12.3 mg/dL — ABNORMAL HIGH (ref 8.9–10.3)
Chloride: 98 mmol/L (ref 98–111)
Creatinine, Ser: 1.4 mg/dL — ABNORMAL HIGH (ref 0.44–1.00)
GFR, Estimated: 34 mL/min — ABNORMAL LOW (ref >60)
Glucose, Bld: 130 mg/dL — ABNORMAL HIGH (ref 70–99)
Potassium: 3.3 mmol/L — ABNORMAL LOW (ref 3.5–5.1)
Sodium: 140 mmol/L (ref 135–145)
Total Bilirubin: 0.3 mg/dL (ref 0.3–1.2)
Total Protein: 6.6 g/dL (ref 6.5–8.1)

## 2021-02-01 MED ORDER — IOHEXOL 300 MG/ML  SOLN
100.0000 mL | Freq: Once | INTRAMUSCULAR | Status: AC | PRN
  Administered 2021-02-01: 100 mL via INTRAVENOUS

## 2021-02-01 MED ORDER — ZOLEDRONIC ACID 4 MG/5ML IV CONC
2.0000 mg | Freq: Once | INTRAVENOUS | Status: AC
  Administered 2021-02-01: 2 mg via INTRAVENOUS
  Filled 2021-02-01: qty 2.5

## 2021-02-01 MED ORDER — SODIUM CHLORIDE 0.9 % IV BOLUS
500.0000 mL | Freq: Once | INTRAVENOUS | Status: AC
  Administered 2021-02-01: 500 mL via INTRAVENOUS

## 2021-02-01 MED ORDER — ZOLEDRONIC ACID 4 MG/5ML IV CONC: 3.0000 mg | Freq: Once | INTRAVENOUS | Status: DC

## 2021-02-01 MED ORDER — SODIUM CHLORIDE 0.9 % IV BOLUS: 500.0000 mL | Freq: Once | INTRAVENOUS | Status: DC

## 2021-02-01 NOTE — Discharge Instructions (Addendum)
Stop taking calcium supplement   Stay hydrated   Recheck kidney function and calcium levels in a week.   You have lung mass and possible colon mass. Talk to your doctor about this. Consider oncology follow up if you want a biopsy   Return to ER if you have worse abdominal pain, weakness, vomiting, dehydration, fever

## 2021-02-01 NOTE — ED Provider Notes (Signed)
  Physical Exam  BP (!) 143/59   Pulse 68   Temp 97.8 F (36.6 C) (Oral)   Resp 18   Ht 5\' 3"  (1.6 m)   Wt 43.1 kg   SpO2 92%   BMI 16.83 kg/m   Physical Exam  ED Course/Procedures     Procedures  MDM  Care assumed at 3 PM.  Patient has hypercalcemia and has abdominal pain and left hip pain after fall.  X-rays were negative signout pending CT abdomen pelvis and also CT of the left hip  5:40 PM CT showed a possible colonic intussusception from a possible mass.  Patient also has a lung mass on CT as well.  Patient has no vomiting is no signs of obstruction right now.  Patient's calcium is 12.3.  Given IV fluids.  I initially wanted to admit patient for hypercalcemia.  Patient really doesn't want to stay in the hospital.  She really wants to go home.  She also does not want work-up for possible malignancy right now.  Her last colonoscopy was several years ago and did not show any obvious mass.  I talked to Dr. Lindi Adie from oncology.  He recommend Zometa.  Due to her creatinine being 1.4 and her GFR is 34%, I ordered half dose of Zometa after discussion with pharmacy.   7:26 PM Finished Zometa. Tolerated PO well. Stable for discharge. Will need repeat BMP in 3-4 days with PCP       Drenda Freeze, MD 02/01/21 [phone]

## 2021-02-01 NOTE — ED Notes (Signed)
Patient transported to CT 

## 2021-02-01 NOTE — ED Triage Notes (Addendum)
,  Left hip and difficulty walking after passing  Out  on Monday, lives by herself , family next door, having hip pain
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
## 2021-02-02 ENCOUNTER — Inpatient Hospital Stay: Payer: Medicare Other | Attending: Hematology and Oncology | Admitting: Hematology and Oncology

## 2021-02-02 ENCOUNTER — Other Ambulatory Visit: Payer: Self-pay

## 2021-02-02 DIAGNOSIS — N189 Chronic kidney disease, unspecified: Secondary | ICD-10-CM | POA: Diagnosis not present

## 2021-02-02 DIAGNOSIS — K589 Irritable bowel syndrome without diarrhea: Secondary | ICD-10-CM | POA: Diagnosis not present

## 2021-02-02 DIAGNOSIS — R54 Age-related physical debility: Secondary | ICD-10-CM | POA: Insufficient documentation

## 2021-02-02 DIAGNOSIS — E1122 Type 2 diabetes mellitus with diabetic chronic kidney disease: Secondary | ICD-10-CM | POA: Diagnosis not present

## 2021-02-02 DIAGNOSIS — M8580 Other specified disorders of bone density and structure, unspecified site: Secondary | ICD-10-CM | POA: Insufficient documentation

## 2021-02-02 DIAGNOSIS — R911 Solitary pulmonary nodule: Secondary | ICD-10-CM | POA: Diagnosis not present

## 2021-02-02 DIAGNOSIS — K561 Intussusception: Secondary | ICD-10-CM | POA: Insufficient documentation

## 2021-02-02 DIAGNOSIS — I129 Hypertensive chronic kidney disease with stage 1 through stage 4 chronic kidney disease, or unspecified chronic kidney disease: Secondary | ICD-10-CM | POA: Insufficient documentation
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
## 2021-02-07 ENCOUNTER — Telehealth: Payer: Self-pay | Admitting: Cardiovascular Disease

## 2021-02-07 NOTE — Telephone Encounter (Signed)
Pt c/o of Chest Pain: STAT if CP now or developed within 24 hours  1. Are you having CP right now? No   2. Are you experiencing any other symptoms (ex. SOB, nausea, vomiting, sweating)? No   3. How long have you been experiencing CP? Off and On for a while   4. Is your CP continuous or coming and going? Continuous   5. Have you taken Nitroglycerin? Took 1 nitroglycerin today and CP subsided after taking     Shelia Holt is calling stating Shelia Holt had CP today. She states it stopped after taking a Nitroglycerin and no other symptoms were present. On 01/29/21  Shelia Holt had a fall at her home and was laying there for 45 minutes - 1 hour. Shelia Holt believed she had a stroke, but Shelia Holt believes she fainted from a dizzy spell due to her BP being so low and no stroke symptoms being present. Shelia Holt states Shelia Holt's diastolic was ranging in the 50's-60's. Due to this she had Shelia Holt stop taking her AmLODipine and hydrochlorothiazide. Shelia Holt is wanting to know if stopping these medication could be the cause of her CP today and also wants Dr. Victorino December medication recommendations in regards to this moving forward. Shelia Holt was not with the patient at the time of call to transfer to triage as a STAT call. Please advise.  ?

## 2021-02-07 NOTE — Telephone Encounter (Signed)
I wish we had a blood pressure that would only lower the systolic blood pressure not the diastolic.  Unfortunately there is not 1.  I agree with stopping the amlodipine and the hydrochlorothiazide.  As long as the systolic blood pressure remains in the 160s or lower and she does not have frequent angina, I would not restart antihypertensive medications. An additional challenge may come from the fact that the higher the systolic blood pressure the more likely it is that Shelia Holt will have chest discomfort.  We may be able to get around this with intermittent sublingual nitroglycerin administration, if the spells are infrequent.  If they occur frequently, we can try to prescribe Ranexa which will not have any impact on her blood pressure.

## 2021-02-07 NOTE — Telephone Encounter (Signed)
Spoke with patient's daughter. She stated that the patient had an episode of chest pain last week which was resolved with nitro. She stated that this is the patient's normal and it does happen occasionally. She feels that it is stress related.   The patient was recently in the hospital due to a fall from syncope. The daughter is concerned that it was due to low diastolic blood pressure. She stated that she stopped the amlodipine and hydrochlorothiazide last week. She stated that the patient's blood pressure stays around 160/50's. She did not have any exact readings since she was not with the patient. She stated that the patient's diastolic has not gotten over 64 since stopping the amlodipine and the HCTZ.   She has been trying to keep the patient hydrated and did state that she is not as independent as she used to be. She is not able to come to an appointment anymore. The daughter would like to know if there is a medication that will decrease the systolic but not the diastolic.

## 2021-02-07 NOTE — Telephone Encounter (Signed)
The patient's daughter has been made aware. Amlodipine and HCTZ have been taken off of her list. She will call with an update if anything changes.

## 2021-02-14 DIAGNOSIS — I10 Essential (primary) hypertension: Secondary | ICD-10-CM | POA: Diagnosis not present

## 2021-02-14 DIAGNOSIS — E1142 Type 2 diabetes mellitus with diabetic polyneuropathy: Secondary | ICD-10-CM | POA: Diagnosis not present

## 2021-02-14 DIAGNOSIS — D352 Benign neoplasm of pituitary gland: Secondary | ICD-10-CM | POA: Diagnosis not present

## 2021-02-14 DIAGNOSIS — N189 Chronic kidney disease, unspecified: Secondary | ICD-10-CM | POA: Diagnosis not present

## 2021-02-14 DIAGNOSIS — E119 Type 2 diabetes mellitus without complications: Secondary | ICD-10-CM | POA: Diagnosis not present

## 2021-02-14 DIAGNOSIS — R935 Abnormal findings on diagnostic imaging of other abdominal regions, including retroperitoneum: Secondary | ICD-10-CM | POA: Diagnosis not present

## 2021-02-14 DIAGNOSIS — Z0000 Encounter for general adult medical examination without abnormal findings: Secondary | ICD-10-CM | POA: Diagnosis not present

## 2021-02-14 DIAGNOSIS — R1032 Left lower quadrant pain: Secondary | ICD-10-CM | POA: Diagnosis not present

## 2021-02-14 DIAGNOSIS — E1121 Type 2 diabetes mellitus with diabetic nephropathy: Secondary | ICD-10-CM | POA: Diagnosis not present

## 2021-02-14 DIAGNOSIS — E559 Vitamin D deficiency, unspecified: Secondary | ICD-10-CM | POA: Diagnosis not present

## 2021-02-14 DIAGNOSIS — E782 Mixed hyperlipidemia: Secondary | ICD-10-CM | POA: Diagnosis not present

## 2021-04-18 DIAGNOSIS — H401133 Primary open-angle glaucoma, bilateral, severe stage: Secondary | ICD-10-CM | POA: Diagnosis not present

## 2021-04-18 DIAGNOSIS — E119 Type 2 diabetes mellitus without complications: Secondary | ICD-10-CM | POA: Diagnosis not present

## 2021-04-18 DIAGNOSIS — H04123 Dry eye syndrome of bilateral lacrimal glands: Secondary | ICD-10-CM | POA: Diagnosis not present

## 2021-04-18 DIAGNOSIS — H43392 Other vitreous opacities, left eye: Secondary | ICD-10-CM | POA: Diagnosis not present

## 2021-04-20 DIAGNOSIS — D509 Iron deficiency anemia, unspecified: Secondary | ICD-10-CM | POA: Diagnosis not present

## 2021-04-20 DIAGNOSIS — I10 Essential (primary) hypertension: Secondary | ICD-10-CM | POA: Diagnosis not present

## 2021-04-20 DIAGNOSIS — E1121 Type 2 diabetes mellitus with diabetic nephropathy: Secondary | ICD-10-CM | POA: Diagnosis not present

## 2021-04-20 DIAGNOSIS — E089 Diabetes mellitus due to underlying condition without complications: Secondary | ICD-10-CM | POA: Diagnosis not present

## 2021-04-24 DIAGNOSIS — L98491 Non-pressure chronic ulcer of skin of other sites limited to breakdown of skin: Secondary | ICD-10-CM | POA: Diagnosis not present

## 2021-04-24 DIAGNOSIS — E778 Other disorders of glycoprotein metabolism: Secondary | ICD-10-CM | POA: Diagnosis not present

## 2021-04-24 DIAGNOSIS — R1030 Lower abdominal pain, unspecified: Secondary | ICD-10-CM | POA: Diagnosis not present

## 2021-04-24 DIAGNOSIS — R600 Localized edema: Secondary | ICD-10-CM | POA: Diagnosis not present

## 2021-04-28 DIAGNOSIS — H02059 Trichiasis without entropian unspecified eye, unspecified eyelid: Secondary | ICD-10-CM | POA: Diagnosis not present

## 2021-04-28 DIAGNOSIS — D509 Iron deficiency anemia, unspecified: Secondary | ICD-10-CM | POA: Diagnosis not present

## 2021-04-28 DIAGNOSIS — H31401 Unspecified choroidal detachment, right eye: Secondary | ICD-10-CM | POA: Diagnosis not present

## 2021-04-28 DIAGNOSIS — M48062 Spinal stenosis, lumbar region with neurogenic claudication: Secondary | ICD-10-CM | POA: Diagnosis not present

## 2021-04-28 DIAGNOSIS — E221 Hyperprolactinemia: Secondary | ICD-10-CM | POA: Diagnosis not present

## 2021-04-28 DIAGNOSIS — H1830 Unspecified corneal membrane change: Secondary | ICD-10-CM | POA: Diagnosis not present

## 2021-04-28 DIAGNOSIS — G4700 Insomnia, unspecified: Secondary | ICD-10-CM | POA: Diagnosis not present

## 2021-04-28 DIAGNOSIS — D352 Benign neoplasm of pituitary gland: Secondary | ICD-10-CM | POA: Diagnosis not present

## 2021-04-28 DIAGNOSIS — J449 Chronic obstructive pulmonary disease, unspecified: Secondary | ICD-10-CM | POA: Diagnosis not present

## 2021-04-28 DIAGNOSIS — H3530 Unspecified macular degeneration: Secondary | ICD-10-CM | POA: Diagnosis not present

## 2021-04-28 DIAGNOSIS — J302 Other seasonal allergic rhinitis: Secondary | ICD-10-CM | POA: Diagnosis not present

## 2021-04-28 DIAGNOSIS — I051 Rheumatic mitral insufficiency: Secondary | ICD-10-CM | POA: Diagnosis not present

## 2021-04-28 DIAGNOSIS — K589 Irritable bowel syndrome without diarrhea: Secondary | ICD-10-CM | POA: Diagnosis not present

## 2021-04-28 DIAGNOSIS — M3500 Sicca syndrome, unspecified: Secondary | ICD-10-CM | POA: Diagnosis not present

## 2021-04-28 DIAGNOSIS — M1990 Unspecified osteoarthritis, unspecified site: Secondary | ICD-10-CM | POA: Diagnosis not present

## 2021-04-28 DIAGNOSIS — I6522 Occlusion and stenosis of left carotid artery: Secondary | ICD-10-CM | POA: Diagnosis not present

## 2021-04-28 DIAGNOSIS — M8580 Other specified disorders of bone density and structure, unspecified site: Secondary | ICD-10-CM | POA: Diagnosis not present

## 2021-04-28 DIAGNOSIS — E785 Hyperlipidemia, unspecified: Secondary | ICD-10-CM | POA: Diagnosis not present

## 2021-04-28 DIAGNOSIS — E1122 Type 2 diabetes mellitus with diabetic chronic kidney disease: Secondary | ICD-10-CM | POA: Diagnosis not present

## 2021-04-28 DIAGNOSIS — M419 Scoliosis, unspecified: Secondary | ICD-10-CM | POA: Diagnosis not present

## 2021-04-28 DIAGNOSIS — E1142 Type 2 diabetes mellitus with diabetic polyneuropathy: Secondary | ICD-10-CM | POA: Diagnosis not present

## 2021-04-28 DIAGNOSIS — H543 Unqualified visual loss, both eyes: Secondary | ICD-10-CM | POA: Diagnosis not present

## 2021-04-28 DIAGNOSIS — I129 Hypertensive chronic kidney disease with stage 1 through stage 4 chronic kidney disease, or unspecified chronic kidney disease: Secondary | ICD-10-CM | POA: Diagnosis not present

## 2021-04-28 DIAGNOSIS — N281 Cyst of kidney, acquired: Secondary | ICD-10-CM | POA: Diagnosis not present

## 2021-04-28 DIAGNOSIS — N183 Chronic kidney disease, stage 3 unspecified: Secondary | ICD-10-CM | POA: Diagnosis not present

## 2021-04-30 DIAGNOSIS — N183 Chronic kidney disease, stage 3 unspecified: Secondary | ICD-10-CM | POA: Diagnosis not present

## 2021-04-30 DIAGNOSIS — J449 Chronic obstructive pulmonary disease, unspecified: Secondary | ICD-10-CM | POA: Diagnosis not present

## 2021-04-30 DIAGNOSIS — E1142 Type 2 diabetes mellitus with diabetic polyneuropathy: Secondary | ICD-10-CM | POA: Diagnosis not present

## 2021-04-30 DIAGNOSIS — M48062 Spinal stenosis, lumbar region with neurogenic claudication: Secondary | ICD-10-CM | POA: Diagnosis not present

## 2021-04-30 DIAGNOSIS — E1122 Type 2 diabetes mellitus with diabetic chronic kidney disease: Secondary | ICD-10-CM | POA: Diagnosis not present

## 2021-04-30 DIAGNOSIS — I129 Hypertensive chronic kidney disease with stage 1 through stage 4 chronic kidney disease, or unspecified chronic kidney disease: Secondary | ICD-10-CM | POA: Diagnosis not present

## 2021-05-01 DIAGNOSIS — N183 Chronic kidney disease, stage 3 unspecified: Secondary | ICD-10-CM | POA: Diagnosis not present

## 2021-05-01 DIAGNOSIS — I129 Hypertensive chronic kidney disease with stage 1 through stage 4 chronic kidney disease, or unspecified chronic kidney disease: Secondary | ICD-10-CM | POA: Diagnosis not present

## 2021-05-01 DIAGNOSIS — E1142 Type 2 diabetes mellitus with diabetic polyneuropathy: Secondary | ICD-10-CM | POA: Diagnosis not present

## 2021-05-01 DIAGNOSIS — M48062 Spinal stenosis, lumbar region with neurogenic claudication: Secondary | ICD-10-CM | POA: Diagnosis not present

## 2021-05-01 DIAGNOSIS — E1122 Type 2 diabetes mellitus with diabetic chronic kidney disease: Secondary | ICD-10-CM | POA: Diagnosis not present

## 2021-05-01 DIAGNOSIS — J449 Chronic obstructive pulmonary disease, unspecified: Secondary | ICD-10-CM | POA: Diagnosis not present

## 2021-05-04 DIAGNOSIS — E1122 Type 2 diabetes mellitus with diabetic chronic kidney disease: Secondary | ICD-10-CM | POA: Diagnosis not present

## 2021-05-04 DIAGNOSIS — E1142 Type 2 diabetes mellitus with diabetic polyneuropathy: Secondary | ICD-10-CM | POA: Diagnosis not present

## 2021-05-04 DIAGNOSIS — I129 Hypertensive chronic kidney disease with stage 1 through stage 4 chronic kidney disease, or unspecified chronic kidney disease: Secondary | ICD-10-CM | POA: Diagnosis not present

## 2021-05-04 DIAGNOSIS — J449 Chronic obstructive pulmonary disease, unspecified: Secondary | ICD-10-CM | POA: Diagnosis not present

## 2021-05-04 DIAGNOSIS — M48062 Spinal stenosis, lumbar region with neurogenic claudication: Secondary | ICD-10-CM | POA: Diagnosis not present

## 2021-05-04 DIAGNOSIS — N183 Chronic kidney disease, stage 3 unspecified: Secondary | ICD-10-CM | POA: Diagnosis not present

## 2021-05-09 DIAGNOSIS — J449 Chronic obstructive pulmonary disease, unspecified: Secondary | ICD-10-CM | POA: Diagnosis not present

## 2021-05-09 DIAGNOSIS — I129 Hypertensive chronic kidney disease with stage 1 through stage 4 chronic kidney disease, or unspecified chronic kidney disease: Secondary | ICD-10-CM | POA: Diagnosis not present

## 2021-05-09 DIAGNOSIS — E1142 Type 2 diabetes mellitus with diabetic polyneuropathy: Secondary | ICD-10-CM | POA: Diagnosis not present

## 2021-05-09 DIAGNOSIS — N183 Chronic kidney disease, stage 3 unspecified: Secondary | ICD-10-CM | POA: Diagnosis not present

## 2021-05-09 DIAGNOSIS — M48062 Spinal stenosis, lumbar region with neurogenic claudication: Secondary | ICD-10-CM | POA: Diagnosis not present

## 2021-05-09 DIAGNOSIS — E1122 Type 2 diabetes mellitus with diabetic chronic kidney disease: Secondary | ICD-10-CM | POA: Diagnosis not present

## 2021-05-10 DIAGNOSIS — N183 Chronic kidney disease, stage 3 unspecified: Secondary | ICD-10-CM | POA: Diagnosis not present

## 2021-05-10 DIAGNOSIS — I129 Hypertensive chronic kidney disease with stage 1 through stage 4 chronic kidney disease, or unspecified chronic kidney disease: Secondary | ICD-10-CM | POA: Diagnosis not present

## 2021-05-10 DIAGNOSIS — E1142 Type 2 diabetes mellitus with diabetic polyneuropathy: Secondary | ICD-10-CM | POA: Diagnosis not present

## 2021-05-10 DIAGNOSIS — J449 Chronic obstructive pulmonary disease, unspecified: Secondary | ICD-10-CM | POA: Diagnosis not present

## 2021-05-10 DIAGNOSIS — E1122 Type 2 diabetes mellitus with diabetic chronic kidney disease: Secondary | ICD-10-CM | POA: Diagnosis not present

## 2021-05-10 DIAGNOSIS — M48062 Spinal stenosis, lumbar region with neurogenic claudication: Secondary | ICD-10-CM | POA: Diagnosis not present

## 2021-05-14 DIAGNOSIS — E1142 Type 2 diabetes mellitus with diabetic polyneuropathy: Secondary | ICD-10-CM | POA: Diagnosis not present

## 2021-05-14 DIAGNOSIS — I129 Hypertensive chronic kidney disease with stage 1 through stage 4 chronic kidney disease, or unspecified chronic kidney disease: Secondary | ICD-10-CM | POA: Diagnosis not present

## 2021-05-14 DIAGNOSIS — N183 Chronic kidney disease, stage 3 unspecified: Secondary | ICD-10-CM | POA: Diagnosis not present

## 2021-05-14 DIAGNOSIS — M48062 Spinal stenosis, lumbar region with neurogenic claudication: Secondary | ICD-10-CM | POA: Diagnosis not present

## 2021-05-14 DIAGNOSIS — E1122 Type 2 diabetes mellitus with diabetic chronic kidney disease: Secondary | ICD-10-CM | POA: Diagnosis not present

## 2021-05-14 DIAGNOSIS — J449 Chronic obstructive pulmonary disease, unspecified: Secondary | ICD-10-CM | POA: Diagnosis not present

## 2021-05-22 DIAGNOSIS — E1122 Type 2 diabetes mellitus with diabetic chronic kidney disease: Secondary | ICD-10-CM | POA: Diagnosis not present

## 2021-05-22 DIAGNOSIS — I129 Hypertensive chronic kidney disease with stage 1 through stage 4 chronic kidney disease, or unspecified chronic kidney disease: Secondary | ICD-10-CM | POA: Diagnosis not present

## 2021-05-22 DIAGNOSIS — N183 Chronic kidney disease, stage 3 unspecified: Secondary | ICD-10-CM | POA: Diagnosis not present

## 2021-05-22 DIAGNOSIS — M48062 Spinal stenosis, lumbar region with neurogenic claudication: Secondary | ICD-10-CM | POA: Diagnosis not present

## 2021-05-22 DIAGNOSIS — J449 Chronic obstructive pulmonary disease, unspecified: Secondary | ICD-10-CM | POA: Diagnosis not present

## 2021-05-22 DIAGNOSIS — E1142 Type 2 diabetes mellitus with diabetic polyneuropathy: Secondary | ICD-10-CM | POA: Diagnosis not present

## 2021-05-24 DIAGNOSIS — E1122 Type 2 diabetes mellitus with diabetic chronic kidney disease: Secondary | ICD-10-CM | POA: Diagnosis not present

## 2021-05-24 DIAGNOSIS — N183 Chronic kidney disease, stage 3 unspecified: Secondary | ICD-10-CM | POA: Diagnosis not present

## 2021-05-24 DIAGNOSIS — I129 Hypertensive chronic kidney disease with stage 1 through stage 4 chronic kidney disease, or unspecified chronic kidney disease: Secondary | ICD-10-CM | POA: Diagnosis not present

## 2021-05-24 DIAGNOSIS — J449 Chronic obstructive pulmonary disease, unspecified: Secondary | ICD-10-CM | POA: Diagnosis not present

## 2021-05-24 DIAGNOSIS — E1142 Type 2 diabetes mellitus with diabetic polyneuropathy: Secondary | ICD-10-CM | POA: Diagnosis not present

## 2021-05-24 DIAGNOSIS — M48062 Spinal stenosis, lumbar region with neurogenic claudication: Secondary | ICD-10-CM | POA: Diagnosis not present

## 2021-05-28 DIAGNOSIS — M8580 Other specified disorders of bone density and structure, unspecified site: Secondary | ICD-10-CM | POA: Diagnosis not present

## 2021-05-28 DIAGNOSIS — H1830 Unspecified corneal membrane change: Secondary | ICD-10-CM | POA: Diagnosis not present

## 2021-05-28 DIAGNOSIS — H31401 Unspecified choroidal detachment, right eye: Secondary | ICD-10-CM | POA: Diagnosis not present

## 2021-05-28 DIAGNOSIS — J449 Chronic obstructive pulmonary disease, unspecified: Secondary | ICD-10-CM | POA: Diagnosis not present

## 2021-05-28 DIAGNOSIS — N183 Chronic kidney disease, stage 3 unspecified: Secondary | ICD-10-CM | POA: Diagnosis not present

## 2021-05-28 DIAGNOSIS — D352 Benign neoplasm of pituitary gland: Secondary | ICD-10-CM | POA: Diagnosis not present

## 2021-05-28 DIAGNOSIS — M48062 Spinal stenosis, lumbar region with neurogenic claudication: Secondary | ICD-10-CM | POA: Diagnosis not present

## 2021-05-28 DIAGNOSIS — H02059 Trichiasis without entropian unspecified eye, unspecified eyelid: Secondary | ICD-10-CM | POA: Diagnosis not present

## 2021-05-28 DIAGNOSIS — N281 Cyst of kidney, acquired: Secondary | ICD-10-CM | POA: Diagnosis not present

## 2021-05-28 DIAGNOSIS — M419 Scoliosis, unspecified: Secondary | ICD-10-CM | POA: Diagnosis not present

## 2021-05-28 DIAGNOSIS — K589 Irritable bowel syndrome without diarrhea: Secondary | ICD-10-CM | POA: Diagnosis not present

## 2021-05-28 DIAGNOSIS — H3530 Unspecified macular degeneration: Secondary | ICD-10-CM | POA: Diagnosis not present

## 2021-05-28 DIAGNOSIS — M3500 Sicca syndrome, unspecified: Secondary | ICD-10-CM | POA: Diagnosis not present

## 2021-05-28 DIAGNOSIS — I051 Rheumatic mitral insufficiency: Secondary | ICD-10-CM | POA: Diagnosis not present

## 2021-05-28 DIAGNOSIS — E785 Hyperlipidemia, unspecified: Secondary | ICD-10-CM | POA: Diagnosis not present

## 2021-05-28 DIAGNOSIS — D509 Iron deficiency anemia, unspecified: Secondary | ICD-10-CM | POA: Diagnosis not present

## 2021-05-28 DIAGNOSIS — G4700 Insomnia, unspecified: Secondary | ICD-10-CM | POA: Diagnosis not present

## 2021-05-28 DIAGNOSIS — H543 Unqualified visual loss, both eyes: Secondary | ICD-10-CM | POA: Diagnosis not present

## 2021-05-28 DIAGNOSIS — M1990 Unspecified osteoarthritis, unspecified site: Secondary | ICD-10-CM | POA: Diagnosis not present

## 2021-05-28 DIAGNOSIS — E1142 Type 2 diabetes mellitus with diabetic polyneuropathy: Secondary | ICD-10-CM | POA: Diagnosis not present

## 2021-05-28 DIAGNOSIS — E221 Hyperprolactinemia: Secondary | ICD-10-CM | POA: Diagnosis not present

## 2021-05-28 DIAGNOSIS — I129 Hypertensive chronic kidney disease with stage 1 through stage 4 chronic kidney disease, or unspecified chronic kidney disease: Secondary | ICD-10-CM | POA: Diagnosis not present

## 2021-05-28 DIAGNOSIS — J302 Other seasonal allergic rhinitis: Secondary | ICD-10-CM | POA: Diagnosis not present

## 2021-05-28 DIAGNOSIS — E1122 Type 2 diabetes mellitus with diabetic chronic kidney disease: Secondary | ICD-10-CM | POA: Diagnosis not present

## 2021-05-28 DIAGNOSIS — I6522 Occlusion and stenosis of left carotid artery: Secondary | ICD-10-CM | POA: Diagnosis not present

## 2021-05-30 DIAGNOSIS — I129 Hypertensive chronic kidney disease with stage 1 through stage 4 chronic kidney disease, or unspecified chronic kidney disease: Secondary | ICD-10-CM | POA: Diagnosis not present

## 2021-05-30 DIAGNOSIS — E1142 Type 2 diabetes mellitus with diabetic polyneuropathy: Secondary | ICD-10-CM | POA: Diagnosis not present

## 2021-05-30 DIAGNOSIS — E1122 Type 2 diabetes mellitus with diabetic chronic kidney disease: Secondary | ICD-10-CM | POA: Diagnosis not present

## 2021-05-30 DIAGNOSIS — M48062 Spinal stenosis, lumbar region with neurogenic claudication: Secondary | ICD-10-CM | POA: Diagnosis not present

## 2021-05-30 DIAGNOSIS — J449 Chronic obstructive pulmonary disease, unspecified: Secondary | ICD-10-CM | POA: Diagnosis not present

## 2021-05-30 DIAGNOSIS — N183 Chronic kidney disease, stage 3 unspecified: Secondary | ICD-10-CM | POA: Diagnosis not present

## 2021-06-05 DIAGNOSIS — E089 Diabetes mellitus due to underlying condition without complications: Secondary | ICD-10-CM | POA: Diagnosis not present

## 2021-06-05 DIAGNOSIS — J449 Chronic obstructive pulmonary disease, unspecified: Secondary | ICD-10-CM | POA: Diagnosis not present

## 2021-06-05 DIAGNOSIS — E1142 Type 2 diabetes mellitus with diabetic polyneuropathy: Secondary | ICD-10-CM | POA: Diagnosis not present

## 2021-06-05 DIAGNOSIS — N183 Chronic kidney disease, stage 3 unspecified: Secondary | ICD-10-CM | POA: Diagnosis not present

## 2021-06-05 DIAGNOSIS — I129 Hypertensive chronic kidney disease with stage 1 through stage 4 chronic kidney disease, or unspecified chronic kidney disease: Secondary | ICD-10-CM | POA: Diagnosis not present

## 2021-06-05 DIAGNOSIS — E1122 Type 2 diabetes mellitus with diabetic chronic kidney disease: Secondary | ICD-10-CM | POA: Diagnosis not present

## 2021-06-05 DIAGNOSIS — M48062 Spinal stenosis, lumbar region with neurogenic claudication: Secondary | ICD-10-CM | POA: Diagnosis not present

## 2021-06-05 DIAGNOSIS — D509 Iron deficiency anemia, unspecified: Secondary | ICD-10-CM | POA: Diagnosis not present

## 2021-06-06 DIAGNOSIS — N183 Chronic kidney disease, stage 3 unspecified: Secondary | ICD-10-CM | POA: Diagnosis not present

## 2021-06-06 DIAGNOSIS — I129 Hypertensive chronic kidney disease with stage 1 through stage 4 chronic kidney disease, or unspecified chronic kidney disease: Secondary | ICD-10-CM | POA: Diagnosis not present

## 2021-06-06 DIAGNOSIS — E1142 Type 2 diabetes mellitus with diabetic polyneuropathy: Secondary | ICD-10-CM | POA: Diagnosis not present

## 2021-06-06 DIAGNOSIS — M48062 Spinal stenosis, lumbar region with neurogenic claudication: Secondary | ICD-10-CM | POA: Diagnosis not present

## 2021-06-06 DIAGNOSIS — J449 Chronic obstructive pulmonary disease, unspecified: Secondary | ICD-10-CM | POA: Diagnosis not present

## 2021-06-06 DIAGNOSIS — E1122 Type 2 diabetes mellitus with diabetic chronic kidney disease: Secondary | ICD-10-CM | POA: Diagnosis not present

## 2021-06-14 DIAGNOSIS — E1142 Type 2 diabetes mellitus with diabetic polyneuropathy: Secondary | ICD-10-CM | POA: Diagnosis not present

## 2021-06-14 DIAGNOSIS — I129 Hypertensive chronic kidney disease with stage 1 through stage 4 chronic kidney disease, or unspecified chronic kidney disease: Secondary | ICD-10-CM | POA: Diagnosis not present

## 2021-06-14 DIAGNOSIS — E1122 Type 2 diabetes mellitus with diabetic chronic kidney disease: Secondary | ICD-10-CM | POA: Diagnosis not present

## 2021-06-14 DIAGNOSIS — N183 Chronic kidney disease, stage 3 unspecified: Secondary | ICD-10-CM | POA: Diagnosis not present

## 2021-06-14 DIAGNOSIS — M48062 Spinal stenosis, lumbar region with neurogenic claudication: Secondary | ICD-10-CM | POA: Diagnosis not present

## 2021-06-14 DIAGNOSIS — J449 Chronic obstructive pulmonary disease, unspecified: Secondary | ICD-10-CM | POA: Diagnosis not present

## 2021-06-19 DIAGNOSIS — J449 Chronic obstructive pulmonary disease, unspecified: Secondary | ICD-10-CM | POA: Diagnosis not present

## 2021-06-19 DIAGNOSIS — M48062 Spinal stenosis, lumbar region with neurogenic claudication: Secondary | ICD-10-CM | POA: Diagnosis not present

## 2021-06-19 DIAGNOSIS — E1142 Type 2 diabetes mellitus with diabetic polyneuropathy: Secondary | ICD-10-CM | POA: Diagnosis not present

## 2021-06-19 DIAGNOSIS — I129 Hypertensive chronic kidney disease with stage 1 through stage 4 chronic kidney disease, or unspecified chronic kidney disease: Secondary | ICD-10-CM | POA: Diagnosis not present

## 2021-06-19 DIAGNOSIS — N183 Chronic kidney disease, stage 3 unspecified: Secondary | ICD-10-CM | POA: Diagnosis not present

## 2021-06-19 DIAGNOSIS — E1122 Type 2 diabetes mellitus with diabetic chronic kidney disease: Secondary | ICD-10-CM | POA: Diagnosis not present

## 2021-06-20 DIAGNOSIS — M48062 Spinal stenosis, lumbar region with neurogenic claudication: Secondary | ICD-10-CM | POA: Diagnosis not present

## 2021-06-20 DIAGNOSIS — E1142 Type 2 diabetes mellitus with diabetic polyneuropathy: Secondary | ICD-10-CM | POA: Diagnosis not present

## 2021-06-20 DIAGNOSIS — E1122 Type 2 diabetes mellitus with diabetic chronic kidney disease: Secondary | ICD-10-CM | POA: Diagnosis not present

## 2021-06-20 DIAGNOSIS — J449 Chronic obstructive pulmonary disease, unspecified: Secondary | ICD-10-CM | POA: Diagnosis not present

## 2021-06-20 DIAGNOSIS — N183 Chronic kidney disease, stage 3 unspecified: Secondary | ICD-10-CM | POA: Diagnosis not present

## 2021-06-20 DIAGNOSIS — I129 Hypertensive chronic kidney disease with stage 1 through stage 4 chronic kidney disease, or unspecified chronic kidney disease: Secondary | ICD-10-CM | POA: Diagnosis not present

## 2021-07-09 DIAGNOSIS — R5383 Other fatigue: Secondary | ICD-10-CM | POA: Diagnosis not present

## 2021-07-09 DIAGNOSIS — E1142 Type 2 diabetes mellitus with diabetic polyneuropathy: Secondary | ICD-10-CM | POA: Diagnosis not present

## 2021-07-23 DIAGNOSIS — D509 Iron deficiency anemia, unspecified: Secondary | ICD-10-CM | POA: Diagnosis not present

## 2021-07-24 DIAGNOSIS — D509 Iron deficiency anemia, unspecified: Secondary | ICD-10-CM | POA: Diagnosis not present

## 2021-08-15 DIAGNOSIS — I129 Hypertensive chronic kidney disease with stage 1 through stage 4 chronic kidney disease, or unspecified chronic kidney disease: Secondary | ICD-10-CM | POA: Diagnosis not present

## 2021-08-15 DIAGNOSIS — N189 Chronic kidney disease, unspecified: Secondary | ICD-10-CM | POA: Diagnosis not present

## 2021-08-15 DIAGNOSIS — N1831 Chronic kidney disease, stage 3a: Secondary | ICD-10-CM | POA: Diagnosis not present

## 2021-08-22 ENCOUNTER — Other Ambulatory Visit (HOSPITAL_COMMUNITY): Payer: Self-pay | Admitting: *Deleted

## 2021-08-23 ENCOUNTER — Ambulatory Visit (HOSPITAL_COMMUNITY)
Admission: RE | Admit: 2021-08-23 | Discharge: 2021-08-23 | Disposition: A | Discharge status: Home / Self Care | Payer: Medicare Other | Source: Ambulatory Visit | Attending: Nephrology | Admitting: Nephrology

## 2021-08-23 ENCOUNTER — Other Ambulatory Visit: Payer: Self-pay

## 2021-08-23 DIAGNOSIS — N183 Chronic kidney disease, stage 3 unspecified: Secondary | ICD-10-CM | POA: Insufficient documentation

## 2021-08-23 DIAGNOSIS — D631 Anemia in chronic kidney disease: Secondary | ICD-10-CM | POA: Insufficient documentation

## 2021-08-23 MED ORDER — SODIUM CHLORIDE 0.9 % IV SOLN
510.0000 mg | INTRAVENOUS | Status: DC
  Administered 2021-08-23: 510 mg via INTRAVENOUS
  Filled 2021-08-23: qty 510

## 2021-08-30 ENCOUNTER — Encounter (HOSPITAL_COMMUNITY): Payer: Medicare Other

## 2021-09-14 ENCOUNTER — Other Ambulatory Visit: Payer: Self-pay

## 2021-09-14 ENCOUNTER — Ambulatory Visit (HOSPITAL_COMMUNITY)
Admission: RE | Admit: 2021-09-14 | Discharge: 2021-09-14 | Disposition: A | Discharge status: Home / Self Care | Payer: Medicare Other | Source: Ambulatory Visit | Attending: Nephrology | Admitting: Nephrology

## 2021-09-14 DIAGNOSIS — D631 Anemia in chronic kidney disease: Secondary | ICD-10-CM | POA: Insufficient documentation

## 2021-09-14 DIAGNOSIS — N189 Chronic kidney disease, unspecified: Secondary | ICD-10-CM | POA: Diagnosis not present

## 2021-09-14 MED ORDER — SODIUM CHLORIDE 0.9 % IV SOLN
510.0000 mg | INTRAVENOUS | Status: DC
  Administered 2021-09-14: 510 mg via INTRAVENOUS
  Filled 2021-09-14: qty 510

## 2021-09-18 DIAGNOSIS — Z23 Encounter for immunization: Secondary | ICD-10-CM | POA: Diagnosis not present

## 2021-10-16 DIAGNOSIS — I959 Hypotension, unspecified: Secondary | ICD-10-CM | POA: Diagnosis not present

## 2021-11-01 DIAGNOSIS — R197 Diarrhea, unspecified: Secondary | ICD-10-CM | POA: Diagnosis not present

## 2021-11-27 ENCOUNTER — Emergency Department (HOSPITAL_COMMUNITY): Payer: Medicare Other

## 2021-11-27 ENCOUNTER — Encounter (HOSPITAL_COMMUNITY): Payer: Self-pay | Admitting: Emergency Medicine

## 2021-11-27 ENCOUNTER — Inpatient Hospital Stay (HOSPITAL_COMMUNITY)
Admission: EM | Admit: 2021-11-27 | Discharge: 2021-12-02 | DRG: 177 | Disposition: A | Discharge status: Hospice | Payer: Medicare Other | Attending: Internal Medicine | Admitting: Internal Medicine

## 2021-11-27 DIAGNOSIS — U071 COVID-19: Principal | ICD-10-CM | POA: Diagnosis present

## 2021-11-27 DIAGNOSIS — E119 Type 2 diabetes mellitus without complications: Secondary | ICD-10-CM

## 2021-11-27 DIAGNOSIS — Z87891 Personal history of nicotine dependence: Secondary | ICD-10-CM

## 2021-11-27 DIAGNOSIS — J9811 Atelectasis: Secondary | ICD-10-CM | POA: Diagnosis not present

## 2021-11-27 DIAGNOSIS — I2699 Other pulmonary embolism without acute cor pulmonale: Secondary | ICD-10-CM | POA: Diagnosis present

## 2021-11-27 DIAGNOSIS — I5033 Acute on chronic diastolic (congestive) heart failure: Secondary | ICD-10-CM | POA: Diagnosis present

## 2021-11-27 DIAGNOSIS — D509 Iron deficiency anemia, unspecified: Secondary | ICD-10-CM | POA: Diagnosis present

## 2021-11-27 DIAGNOSIS — J1282 Pneumonia due to coronavirus disease 2019: Secondary | ICD-10-CM | POA: Diagnosis present

## 2021-11-27 DIAGNOSIS — J9621 Acute and chronic respiratory failure with hypoxia: Secondary | ICD-10-CM | POA: Diagnosis present

## 2021-11-27 DIAGNOSIS — H3530 Unspecified macular degeneration: Secondary | ICD-10-CM | POA: Diagnosis present

## 2021-11-27 DIAGNOSIS — R0689 Other abnormalities of breathing: Secondary | ICD-10-CM | POA: Diagnosis not present

## 2021-11-27 DIAGNOSIS — J439 Emphysema, unspecified: Secondary | ICD-10-CM | POA: Diagnosis not present

## 2021-11-27 DIAGNOSIS — J189 Pneumonia, unspecified organism: Secondary | ICD-10-CM | POA: Diagnosis not present

## 2021-11-27 DIAGNOSIS — R0789 Other chest pain: Secondary | ICD-10-CM | POA: Diagnosis not present

## 2021-11-27 DIAGNOSIS — Z515 Encounter for palliative care: Secondary | ICD-10-CM

## 2021-11-27 DIAGNOSIS — Z833 Family history of diabetes mellitus: Secondary | ICD-10-CM

## 2021-11-27 DIAGNOSIS — R000 Tachycardia, unspecified: Secondary | ICD-10-CM | POA: Diagnosis not present

## 2021-11-27 DIAGNOSIS — I130 Hypertensive heart and chronic kidney disease with heart failure and stage 1 through stage 4 chronic kidney disease, or unspecified chronic kidney disease: Secondary | ICD-10-CM | POA: Diagnosis present

## 2021-11-27 DIAGNOSIS — I5031 Acute diastolic (congestive) heart failure: Secondary | ICD-10-CM | POA: Diagnosis not present

## 2021-11-27 DIAGNOSIS — E1122 Type 2 diabetes mellitus with diabetic chronic kidney disease: Secondary | ICD-10-CM | POA: Diagnosis present

## 2021-11-27 DIAGNOSIS — R0603 Acute respiratory distress: Secondary | ICD-10-CM | POA: Diagnosis not present

## 2021-11-27 DIAGNOSIS — R627 Adult failure to thrive: Secondary | ICD-10-CM | POA: Diagnosis present

## 2021-11-27 DIAGNOSIS — Z66 Do not resuscitate: Secondary | ICD-10-CM | POA: Diagnosis present

## 2021-11-27 DIAGNOSIS — J90 Pleural effusion, not elsewhere classified: Secondary | ICD-10-CM | POA: Diagnosis not present

## 2021-11-27 DIAGNOSIS — I2721 Secondary pulmonary arterial hypertension: Secondary | ICD-10-CM | POA: Diagnosis present

## 2021-11-27 DIAGNOSIS — Z743 Need for continuous supervision: Secondary | ICD-10-CM | POA: Diagnosis not present

## 2021-11-27 DIAGNOSIS — J9601 Acute respiratory failure with hypoxia: Secondary | ICD-10-CM | POA: Diagnosis present

## 2021-11-27 DIAGNOSIS — R059 Cough, unspecified: Secondary | ICD-10-CM | POA: Diagnosis not present

## 2021-11-27 DIAGNOSIS — H409 Unspecified glaucoma: Secondary | ICD-10-CM | POA: Diagnosis present

## 2021-11-27 DIAGNOSIS — M8580 Other specified disorders of bone density and structure, unspecified site: Secondary | ICD-10-CM | POA: Diagnosis present

## 2021-11-27 DIAGNOSIS — Z823 Family history of stroke: Secondary | ICD-10-CM

## 2021-11-27 DIAGNOSIS — I82411 Acute embolism and thrombosis of right femoral vein: Secondary | ICD-10-CM | POA: Diagnosis present

## 2021-11-27 DIAGNOSIS — Z8051 Family history of malignant neoplasm of kidney: Secondary | ICD-10-CM | POA: Diagnosis not present

## 2021-11-27 DIAGNOSIS — R54 Age-related physical debility: Secondary | ICD-10-CM | POA: Diagnosis present

## 2021-11-27 DIAGNOSIS — Z7189 Other specified counseling: Secondary | ICD-10-CM | POA: Diagnosis not present

## 2021-11-27 DIAGNOSIS — Z7401 Bed confinement status: Secondary | ICD-10-CM | POA: Diagnosis not present

## 2021-11-27 DIAGNOSIS — J159 Unspecified bacterial pneumonia: Secondary | ICD-10-CM | POA: Diagnosis present

## 2021-11-27 DIAGNOSIS — J449 Chronic obstructive pulmonary disease, unspecified: Secondary | ICD-10-CM | POA: Diagnosis present

## 2021-11-27 DIAGNOSIS — E8809 Other disorders of plasma-protein metabolism, not elsewhere classified: Secondary | ICD-10-CM | POA: Diagnosis present

## 2021-11-27 DIAGNOSIS — I351 Nonrheumatic aortic (valve) insufficiency: Secondary | ICD-10-CM | POA: Diagnosis present

## 2021-11-27 DIAGNOSIS — R0602 Shortness of breath: Secondary | ICD-10-CM | POA: Diagnosis not present

## 2021-11-27 DIAGNOSIS — R7989 Other specified abnormal findings of blood chemistry: Secondary | ICD-10-CM | POA: Diagnosis not present

## 2021-11-27 DIAGNOSIS — Z8249 Family history of ischemic heart disease and other diseases of the circulatory system: Secondary | ICD-10-CM

## 2021-11-27 DIAGNOSIS — R079 Chest pain, unspecified: Secondary | ICD-10-CM | POA: Diagnosis not present

## 2021-11-27 DIAGNOSIS — Z803 Family history of malignant neoplasm of breast: Secondary | ICD-10-CM | POA: Diagnosis not present

## 2021-11-27 DIAGNOSIS — N183 Chronic kidney disease, stage 3 unspecified: Secondary | ICD-10-CM | POA: Diagnosis not present

## 2021-11-27 DIAGNOSIS — N1831 Chronic kidney disease, stage 3a: Secondary | ICD-10-CM | POA: Diagnosis present

## 2021-11-27 DIAGNOSIS — I959 Hypotension, unspecified: Secondary | ICD-10-CM | POA: Diagnosis not present

## 2021-11-27 DIAGNOSIS — Z888 Allergy status to other drugs, medicaments and biological substances status: Secondary | ICD-10-CM

## 2021-11-27 DIAGNOSIS — A419 Sepsis, unspecified organism: Secondary | ICD-10-CM | POA: Diagnosis not present

## 2021-11-27 DIAGNOSIS — I517 Cardiomegaly: Secondary | ICD-10-CM | POA: Diagnosis not present

## 2021-11-27 LAB — URINALYSIS, ROUTINE W REFLEX MICROSCOPIC
Bilirubin Urine: NEGATIVE
Glucose, UA: NEGATIVE mg/dL
Ketones, ur: NEGATIVE mg/dL
Nitrite: NEGATIVE
Protein, ur: NEGATIVE mg/dL
Specific Gravity, Urine: 1.025 (ref 1.005–1.030)
pH: 5.5 (ref 5.0–8.0)

## 2021-11-27 LAB — URINALYSIS, MICROSCOPIC (REFLEX)

## 2021-11-27 LAB — IRON AND TIBC
Iron: 9 ug/dL — ABNORMAL LOW (ref 28–170)
Saturation Ratios: 6 % — ABNORMAL LOW (ref 10.4–31.8)
TIBC: 161 ug/dL — ABNORMAL LOW (ref 250–450)
UIBC: 152 ug/dL

## 2021-11-27 LAB — COMPREHENSIVE METABOLIC PANEL
ALT: 9 U/L (ref 0–44)
AST: 20 U/L (ref 15–41)
Albumin: 2 g/dL — ABNORMAL LOW (ref 3.5–5.0)
Alkaline Phosphatase: 28 U/L — ABNORMAL LOW (ref 38–126)
Anion gap: 13 (ref 5–15)
BUN: 50 mg/dL — ABNORMAL HIGH (ref 8–23)
CO2: 20 mmol/L — ABNORMAL LOW (ref 22–32)
Calcium: 9.1 mg/dL (ref 8.9–10.3)
Chloride: 102 mmol/L (ref 98–111)
Creatinine, Ser: 1.05 mg/dL — ABNORMAL HIGH (ref 0.44–1.00)
GFR, Estimated: 48 mL/min — ABNORMAL LOW (ref >60)
Glucose, Bld: 148 mg/dL — ABNORMAL HIGH (ref 70–99)
Potassium: 4 mmol/L (ref 3.5–5.1)
Sodium: 135 mmol/L (ref 135–145)
Total Bilirubin: 0.8 mg/dL (ref 0.3–1.2)
Total Protein: 5.6 g/dL — ABNORMAL LOW (ref 6.5–8.1)

## 2021-11-27 LAB — CBC WITH DIFFERENTIAL/PLATELET
Abs Immature Granulocytes: 0 10*3/uL (ref 0.00–0.07)
Basophils Absolute: 0 10*3/uL (ref 0.0–0.1)
Basophils Relative: 0 %
Eosinophils Absolute: 0.2 10*3/uL (ref 0.0–0.5)
Eosinophils Relative: 1 %
HCT: 27.3 % — ABNORMAL LOW (ref 36.0–46.0)
Hemoglobin: 8.4 g/dL — ABNORMAL LOW (ref 12.0–15.0)
Lymphocytes Relative: 4 %
Lymphs Abs: 0.7 10*3/uL (ref 0.7–4.0)
MCH: 31.7 pg (ref 26.0–34.0)
MCHC: 30.8 g/dL (ref 30.0–36.0)
MCV: 103 fL — ABNORMAL HIGH (ref 80.0–100.0)
Monocytes Absolute: 1.4 10*3/uL — ABNORMAL HIGH (ref 0.1–1.0)
Monocytes Relative: 8 %
Neutro Abs: 15 10*3/uL — ABNORMAL HIGH (ref 1.7–7.7)
Neutrophils Relative %: 87 %
Platelets: 375 10*3/uL (ref 150–400)
RBC: 2.65 MIL/uL — ABNORMAL LOW (ref 3.87–5.11)
RDW: 16 % — ABNORMAL HIGH (ref 11.5–15.5)
WBC: 17.2 10*3/uL — ABNORMAL HIGH (ref 4.0–10.5)
nRBC: 0 % (ref 0.0–0.2)
nRBC: 1 /100 WBC — ABNORMAL HIGH

## 2021-11-27 LAB — RESP PANEL BY RT-PCR (FLU A&B, COVID) ARPGX2
Influenza A by PCR: NEGATIVE
Influenza B by PCR: NEGATIVE
SARS Coronavirus 2 by RT PCR: POSITIVE — AB

## 2021-11-27 LAB — LACTATE DEHYDROGENASE: LDH: 136 U/L (ref 98–192)

## 2021-11-27 LAB — RETICULOCYTES
Immature Retic Fract: 24.5 % — ABNORMAL HIGH (ref 2.3–15.9)
RBC.: 2.34 MIL/uL — ABNORMAL LOW (ref 3.87–5.11)
Retic Count, Absolute: 66.7 10*3/uL (ref 19.0–186.0)
Retic Ct Pct: 2.9 % (ref 0.4–3.1)

## 2021-11-27 LAB — PROTIME-INR
INR: 1.1 (ref 0.8–1.2)
Prothrombin Time: 14.5 seconds (ref 11.4–15.2)

## 2021-11-27 LAB — LACTIC ACID, PLASMA
Lactic Acid, Venous: 1.6 mmol/L (ref 0.5–1.9)
Lactic Acid, Venous: 1 mmol/L (ref 0.5–1.9)

## 2021-11-27 LAB — PROCALCITONIN: Procalcitonin: 2.4 ng/mL

## 2021-11-27 LAB — D-DIMER, QUANTITATIVE: D-Dimer, Quant: 3.35 ug/mL-FEU — ABNORMAL HIGH (ref 0.00–0.50)

## 2021-11-27 LAB — FIBRINOGEN: Fibrinogen: >800 mg/dL — ABNORMAL HIGH (ref 210–475)

## 2021-11-27 LAB — FOLATE: Folate: 18.1 ng/mL (ref >5.9)

## 2021-11-27 LAB — C-REACTIVE PROTEIN: CRP: 24.4 mg/dL — ABNORMAL HIGH (ref <1.0)

## 2021-11-27 LAB — TRIGLYCERIDES: Triglycerides: 55 mg/dL (ref <150)

## 2021-11-27 LAB — VITAMIN B12: Vitamin B-12: 589 pg/mL (ref 180–914)

## 2021-11-27 LAB — APTT: aPTT: 30 seconds (ref 24–36)

## 2021-11-27 LAB — FERRITIN: Ferritin: 441 ng/mL — ABNORMAL HIGH (ref 11–307)

## 2021-11-27 MED ORDER — ACETAMINOPHEN 650 MG RE SUPP: 650.0000 mg | Freq: Four times a day (QID) | RECTAL | Status: DC | PRN

## 2021-11-27 MED ORDER — DEXAMETHASONE SODIUM PHOSPHATE 10 MG/ML IJ SOLN: 6.0000 mg | INTRAMUSCULAR | Status: DC

## 2021-11-27 MED ORDER — SODIUM CHLORIDE 0.9 % IV SOLN
100.0000 mg | Freq: Every day | INTRAVENOUS | Status: AC
  Administered 2021-11-28 – 2021-12-01 (×4): 100 mg via INTRAVENOUS
  Filled 2021-11-27 (×7): qty 20

## 2021-11-27 MED ORDER — MELATONIN 3 MG PO TABS
3.0000 mg | ORAL_TABLET | Freq: Every day | ORAL | Status: DC
  Administered 2021-11-27 – 2021-12-01 (×5): 3 mg via ORAL
  Filled 2021-11-27 (×5): qty 1

## 2021-11-27 MED ORDER — DEXAMETHASONE SODIUM PHOSPHATE 10 MG/ML IJ SOLN
10.0000 mg | Freq: Once | INTRAMUSCULAR | Status: AC
  Administered 2021-11-27: 10 mg via INTRAVENOUS
  Filled 2021-11-27: qty 1

## 2021-11-27 MED ORDER — INSULIN ASPART 100 UNIT/ML IJ SOLN
0.0000 [IU] | Freq: Three times a day (TID) | INTRAMUSCULAR | Status: DC
  Administered 2021-11-28 (×2): 3 [IU] via SUBCUTANEOUS
  Administered 2021-11-28: 1 [IU] via SUBCUTANEOUS
  Administered 2021-11-29: 5 [IU] via SUBCUTANEOUS
  Administered 2021-11-29: 2 [IU] via SUBCUTANEOUS
  Administered 2021-11-29: 3 [IU] via SUBCUTANEOUS
  Administered 2021-11-30: 2 [IU] via SUBCUTANEOUS
  Administered 2021-11-30 – 2021-12-02 (×3): 1 [IU] via SUBCUTANEOUS

## 2021-11-27 MED ORDER — ASCORBIC ACID 500 MG PO TABS
500.0000 mg | ORAL_TABLET | Freq: Every day | ORAL | Status: DC
  Administered 2021-11-27 – 2021-12-02 (×6): 500 mg via ORAL
  Filled 2021-11-27 (×6): qty 1

## 2021-11-27 MED ORDER — CYCLOSPORINE 0.05 % OP EMUL
1.0000 [drp] | Freq: Two times a day (BID) | OPHTHALMIC | Status: DC
  Administered 2021-11-27 – 2021-12-02 (×9): 1 [drp] via OPHTHALMIC
  Filled 2021-11-27 (×12): qty 30

## 2021-11-27 MED ORDER — HYDROCOD POLST-CPM POLST ER 10-8 MG/5ML PO SUER
5.0000 mL | Freq: Two times a day (BID) | ORAL | Status: DC | PRN
  Administered 2021-11-28 – 2021-12-02 (×3): 5 mL via ORAL
  Filled 2021-11-27 (×3): qty 5

## 2021-11-27 MED ORDER — LACTATED RINGERS IV SOLN: INTRAVENOUS | Status: DC

## 2021-11-27 MED ORDER — METHAZOLAMIDE 25 MG PO TABS
25.0000 mg | ORAL_TABLET | Freq: Two times a day (BID) | ORAL | Status: DC
  Administered 2021-11-27 – 2021-12-02 (×10): 25 mg via ORAL
  Filled 2021-11-27 (×11): qty 1

## 2021-11-27 MED ORDER — POLYETHYLENE GLYCOL 3350 17 G PO PACK: 17.0000 g | PACK | Freq: Every day | ORAL | Status: DC | PRN

## 2021-11-27 MED ORDER — NETARSUDIL-LATANOPROST 0.02-0.005 % OP SOLN
1.0000 [drp] | Freq: Every day | OPHTHALMIC | Status: DC
  Administered 2021-11-29 – 2021-12-01 (×3): 1 [drp] via OPHTHALMIC

## 2021-11-27 MED ORDER — POLYVINYL ALCOHOL 1.4 % OP SOLN
1.0000 [drp] | Freq: Every day | OPHTHALMIC | Status: DC | PRN
  Filled 2021-11-27: qty 15

## 2021-11-27 MED ORDER — ALBUTEROL SULFATE (2.5 MG/3ML) 0.083% IN NEBU
2.5000 mg | INHALATION_SOLUTION | Freq: Four times a day (QID) | RESPIRATORY_TRACT | Status: DC | PRN
  Administered 2021-11-28 – 2021-12-02 (×6): 2.5 mg via RESPIRATORY_TRACT
  Filled 2021-11-27 (×7): qty 3

## 2021-11-27 MED ORDER — PILOCARPINE HCL 2 % OP SOLN
1.0000 [drp] | Freq: Three times a day (TID) | OPHTHALMIC | Status: DC
  Administered 2021-11-28 – 2021-12-02 (×11): 1 [drp] via OPHTHALMIC
  Filled 2021-11-27 (×2): qty 15

## 2021-11-27 MED ORDER — TRAMADOL HCL 50 MG PO TABS: 50.0000 mg | ORAL_TABLET | Freq: Four times a day (QID) | ORAL | Status: DC | PRN

## 2021-11-27 MED ORDER — ENOXAPARIN SODIUM 30 MG/0.3ML IJ SOSY
30.0000 mg | PREFILLED_SYRINGE | INTRAMUSCULAR | Status: DC
  Administered 2021-11-27: 30 mg via SUBCUTANEOUS
  Filled 2021-11-27: qty 0.3

## 2021-11-27 MED ORDER — GUAIFENESIN-DM 100-10 MG/5ML PO SYRP
10.0000 mL | ORAL_SOLUTION | ORAL | Status: DC | PRN
  Administered 2021-11-28 – 2021-12-02 (×9): 10 mL via ORAL
  Filled 2021-11-27 (×9): qty 10

## 2021-11-27 MED ORDER — TRAMADOL HCL 50 MG PO TABS
50.0000 mg | ORAL_TABLET | Freq: Four times a day (QID) | ORAL | Status: DC | PRN
Start: 2021-11-27 — End: 2021-11-28
  Administered 2021-11-27 – 2021-11-28 (×2): 50 mg via ORAL
  Filled 2021-11-27 (×2): qty 1

## 2021-11-27 MED ORDER — UMECLIDINIUM BROMIDE 62.5 MCG/ACT IN AEPB
1.0000 | INHALATION_SPRAY | Freq: Every day | RESPIRATORY_TRACT | Status: DC
  Administered 2021-11-27 – 2021-12-02 (×4): 1 via RESPIRATORY_TRACT
  Filled 2021-11-27: qty 7

## 2021-11-27 MED ORDER — DORZOLAMIDE HCL-TIMOLOL MAL 2-0.5 % OP SOLN
1.0000 [drp] | Freq: Two times a day (BID) | OPHTHALMIC | Status: DC
  Administered 2021-11-29 – 2021-12-02 (×6): 1 [drp] via OPHTHALMIC
  Filled 2021-11-27: qty 10

## 2021-11-27 MED ORDER — SODIUM CHLORIDE 0.9 % IV SOLN
2.0000 g | INTRAVENOUS | Status: DC
  Administered 2021-11-27: 2 g via INTRAVENOUS
  Filled 2021-11-27: qty 20

## 2021-11-27 MED ORDER — ACETAMINOPHEN 325 MG PO TABS: 650.0000 mg | ORAL_TABLET | Freq: Four times a day (QID) | ORAL | Status: DC | PRN

## 2021-11-27 MED ORDER — BRIMONIDINE TARTRATE 0.15 % OP SOLN
1.0000 [drp] | Freq: Two times a day (BID) | OPHTHALMIC | Status: DC
  Administered 2021-11-29 – 2021-12-02 (×7): 1 [drp] via OPHTHALMIC
  Filled 2021-11-27 (×2): qty 5

## 2021-11-27 MED ORDER — SODIUM CHLORIDE 0.9% FLUSH
3.0000 mL | Freq: Two times a day (BID) | INTRAVENOUS | Status: DC
  Administered 2021-11-28 – 2021-12-01 (×8): 3 mL via INTRAVENOUS

## 2021-11-27 MED ORDER — IPRATROPIUM BROMIDE 0.02 % IN SOLN: 0.5000 mg | Freq: Four times a day (QID) | RESPIRATORY_TRACT | Status: DC

## 2021-11-27 MED ORDER — LACTATED RINGERS IV BOLUS (SEPSIS)
500.0000 mL | Freq: Once | INTRAVENOUS | Status: AC
  Administered 2021-11-27: 500 mL via INTRAVENOUS

## 2021-11-27 MED ORDER — ZINC SULFATE 220 (50 ZN) MG PO CAPS
220.0000 mg | ORAL_CAPSULE | Freq: Every day | ORAL | Status: DC
  Administered 2021-11-27 – 2021-12-02 (×6): 220 mg via ORAL
  Filled 2021-11-27 (×6): qty 1

## 2021-11-27 MED ORDER — DICLOFENAC SODIUM 1 % EX GEL: 2.0000 g | Freq: Four times a day (QID) | CUTANEOUS | Status: DC | PRN

## 2021-11-27 MED ORDER — SODIUM CHLORIDE 0.9 % IV SOLN
200.0000 mg | Freq: Once | INTRAVENOUS | Status: DC
  Filled 2021-11-27: qty 40

## 2021-11-27 MED ORDER — SODIUM CHLORIDE 0.9 % IV SOLN
200.0000 mg | Freq: Once | INTRAVENOUS | Status: AC
  Administered 2021-11-27: 200 mg via INTRAVENOUS
  Filled 2021-11-27: qty 40

## 2021-11-27 MED ORDER — SODIUM CHLORIDE 0.9 % IV SOLN: 100.0000 mg | Freq: Every day | INTRAVENOUS | Status: DC

## 2021-11-27 MED ORDER — SODIUM CHLORIDE 0.9 % IV SOLN
500.0000 mg | INTRAVENOUS | Status: DC
  Administered 2021-11-27: 500 mg via INTRAVENOUS
  Filled 2021-11-27: qty 5

## 2021-11-27 NOTE — ED Provider Notes (Signed)
Emergency Department Provider Note   I have reviewed the triage vital signs and the nursing notes.   HISTORY  Chief Complaint Shortness of Breath (Productive cough)   HPI Shelia Holt is a 88 y.o. female past medical history reviewed below including COPD, diabetes, hypertension presents to the emergency department with productive cough, fever at home per family, shortness of breath, increased oxygen requirement.  Patient arrives by EMS.  They were called to the patient's home with report of increased trouble breathing after pneumonia was diagnosed last week during a virtual visit.  Patient completed an unknown oral antibiotic yesterday.  Sats today on EMS arrival were 85% on room air with soft blood pressures.  They administered 400 mL of IV fluids and placed the patient on nonrebreather for hypoxemia.  Family report that patient is at her mental status baseline.  Patient denies any chest discomfort.  She denies any abdominal or back pain.  No vomiting or diarrhea.   Level 5 caveat: AMS    Past Medical History:  Diagnosis Date   Anemia    Carotid atherosclerosis 09/23/2012   Doppler: 50-69% left prox.,70-99% left bulb,,0-49% right bulb & prox.   Cerebral vascular disease    Chronic kidney disease    COPD (chronic obstructive pulmonary disease) (HCC)    Degenerative disc disease, cervical    Diabetes mellitus without complication (HCC)    Dizziness    Falls    GERD (gastroesophageal reflux disease)    Glaucoma    History of shingles    Hyperlipidemia    Hyperprolactinemia (HCC)    Hypertension    Insomnia    Irritable bowel syndrome    Mitral valve regurgitation    Osteoarthritis    Osteopenia    Scoliosis    Seasonal allergies    Spinal stenosis    Vaginal wall prolapse     Review of Systems  Constitutional: Positive fever/chills Eyes: No visual changes. ENT: No sore throat. Cardiovascular: Denies chest pain. Respiratory: Positive shortness of breath and  cough.  Gastrointestinal: No abdominal pain.  No nausea, no vomiting.  No diarrhea.  No constipation. Genitourinary: Negative for dysuria. Musculoskeletal: Negative for back pain. Skin: Negative for rash. Neurological: Negative for headaches, focal weakness or numbness.  10-point ROS otherwise negative.  ____________________________________________   PHYSICAL EXAM:  VITAL SIGNS: ED Triage Vitals  Enc Vitals Group     BP 11/27/21 1658 (!) 120/59     Pulse Rate 11/27/21 1658 (!) 111     Resp 11/27/21 1658 (!) 24     Temp 11/27/21 1658 98.3 F (36.8 C)     Temp Source 11/27/21 1658 Oral     SpO2 11/27/21 1658 100 %   Constitutional: Alert.  Eyes: Conjunctivae are normal.  Head: Atraumatic. Nose: No congestion/rhinnorhea. Mouth/Throat: Mucous membranes are moist.  Neck: No stridor.  Cardiovascular: Normal rate, regular rhythm. Good peripheral circulation. Grossly normal heart sounds.   Respiratory: Normal respiratory effort.  No retractions. Lungs CTAB. Gastrointestinal: Soft and nontender. No distention.  Musculoskeletal: No lower extremity tenderness nor edema. No gross deformities of extremities. Neurologic:  Normal speech and language. No gross focal neurologic deficits are appreciated.  Skin:  Skin is warm, dry and intact. No rash noted.   ____________________________________________   LABS (all labs ordered are listed, but only abnormal results are displayed)  Labs Reviewed  RESP PANEL BY RT-PCR (FLU A&B, COVID) ARPGX2 - Abnormal; Notable for the following components:      Result Value  SARS Coronavirus 2 by RT PCR POSITIVE (*)    All other components within normal limits  URINE CULTURE - Abnormal; Notable for the following components:   Culture MULTIPLE SPECIES PRESENT, SUGGEST RECOLLECTION (*)    All other components within normal limits  COMPREHENSIVE METABOLIC PANEL - Abnormal; Notable for the following components:   CO2 20 (*)    Glucose, Bld 148 (*)     BUN 50 (*)    Creatinine, Ser 1.05 (*)    Total Protein 5.6 (*)    Albumin 2.0 (*)    Alkaline Phosphatase 28 (*)    GFR, Estimated 48 (*)    All other components within normal limits  CBC WITH DIFFERENTIAL/PLATELET - Abnormal; Notable for the following components:   WBC 17.2 (*)    RBC 2.65 (*)    Hemoglobin 8.4 (*)    HCT 27.3 (*)    MCV 103.0 (*)    RDW 16.0 (*)    Neutro Abs 15.0 (*)    Monocytes Absolute 1.4 (*)    nRBC 1 (*)    All other components within normal limits  URINALYSIS, ROUTINE W REFLEX MICROSCOPIC - Abnormal; Notable for the following components:   APPearance HAZY (*)    Hgb urine dipstick LARGE (*)    Leukocytes,Ua TRACE (*)    All other components within normal limits  D-DIMER, QUANTITATIVE - Abnormal; Notable for the following components:   D-Dimer, Quant 3.35 (*)    All other components within normal limits  FERRITIN - Abnormal; Notable for the following components:   Ferritin 441 (*)    All other components within normal limits  FIBRINOGEN - Abnormal; Notable for the following components:   Fibrinogen >800 (*)    All other components within normal limits  C-REACTIVE PROTEIN - Abnormal; Notable for the following components:   CRP 24.4 (*)    All other components within normal limits  URINALYSIS, MICROSCOPIC (REFLEX) - Abnormal; Notable for the following components:   Bacteria, UA MANY (*)    Non Squamous Epithelial PRESENT (*)    All other components within normal limits  CBC WITH DIFFERENTIAL/PLATELET - Abnormal; Notable for the following components:   WBC 16.0 (*)    RBC 2.38 (*)    Hemoglobin 7.5 (*)    HCT 24.5 (*)    MCV 102.9 (*)    RDW 16.3 (*)    Neutro Abs 15.2 (*)    Lymphs Abs 0.3 (*)    All other components within normal limits  COMPREHENSIVE METABOLIC PANEL - Abnormal; Notable for the following components:   CO2 20 (*)    Glucose, Bld 139 (*)    BUN 44 (*)    Calcium 8.4 (*)    Total Protein 4.7 (*)    Albumin 1.7 (*)     Alkaline Phosphatase 34 (*)    GFR, Estimated 57 (*)    All other components within normal limits  C-REACTIVE PROTEIN - Abnormal; Notable for the following components:   CRP 24.8 (*)    All other components within normal limits  D-DIMER, QUANTITATIVE - Abnormal; Notable for the following components:   D-Dimer, Quant 3.65 (*)    All other components within normal limits  FERRITIN - Abnormal; Notable for the following components:   Ferritin 491 (*)    All other components within normal limits  IRON AND TIBC - Abnormal; Notable for the following components:   Iron 9 (*)    TIBC 161 (*)  Saturation Ratios 6 (*)    All other components within normal limits  RETICULOCYTES - Abnormal; Notable for the following components:   RBC. 2.34 (*)    Immature Retic Fract 24.5 (*)    All other components within normal limits  BRAIN NATRIURETIC PEPTIDE - Abnormal; Notable for the following components:   B Natriuretic Peptide 675.0 (*)    All other components within normal limits  HEPARIN LEVEL (UNFRACTIONATED) - Abnormal; Notable for the following components:   Heparin Unfractionated <0.10 (*)    All other components within normal limits  CBC - Abnormal; Notable for the following components:   WBC 14.2 (*)    RBC 3.03 (*)    Hemoglobin 9.0 (*)    HCT 29.6 (*)    RDW 18.5 (*)    All other components within normal limits  CBC WITH DIFFERENTIAL/PLATELET - Abnormal; Notable for the following components:   WBC 15.3 (*)    RBC 2.82 (*)    Hemoglobin 8.6 (*)    HCT 26.5 (*)    RDW 18.9 (*)    Neutro Abs 13.0 (*)    Lymphs Abs 0.6 (*)    Monocytes Absolute 1.1 (*)    Abs Immature Granulocytes 0.60 (*)    All other components within normal limits  COMPREHENSIVE METABOLIC PANEL - Abnormal; Notable for the following components:   Sodium 130 (*)    CO2 18 (*)    Glucose, Bld 330 (*)    BUN 46 (*)    Creatinine, Ser 1.01 (*)    Calcium 8.2 (*)    Total Protein 4.8 (*)    Albumin 1.5 (*)     Alkaline Phosphatase 27 (*)    GFR, Estimated 51 (*)    All other components within normal limits  C-REACTIVE PROTEIN - Abnormal; Notable for the following components:   CRP 20.6 (*)    All other components within normal limits  D-DIMER, QUANTITATIVE - Abnormal; Notable for the following components:   D-Dimer, Quant 3.15 (*)    All other components within normal limits  BRAIN NATRIURETIC PEPTIDE - Abnormal; Notable for the following components:   B Natriuretic Peptide 942.3 (*)    All other components within normal limits  HEPARIN LEVEL (UNFRACTIONATED) - Abnormal; Notable for the following components:   Heparin Unfractionated 0.11 (*)    All other components within normal limits  GLUCOSE, CAPILLARY - Abnormal; Notable for the following components:   Glucose-Capillary 199 (*)    All other components within normal limits  GLUCOSE, CAPILLARY - Abnormal; Notable for the following components:   Glucose-Capillary 249 (*)    All other components within normal limits  GLUCOSE, CAPILLARY - Abnormal; Notable for the following components:   Glucose-Capillary 261 (*)    All other components within normal limits  CBC WITH DIFFERENTIAL/PLATELET - Abnormal; Notable for the following components:   WBC 17.9 (*)    RBC 3.05 (*)    Hemoglobin 9.2 (*)    HCT 28.4 (*)    RDW 18.5 (*)    Platelets 417 (*)    Neutro Abs 15.3 (*)    Abs Immature Granulocytes 0.75 (*)    All other components within normal limits  COMPREHENSIVE METABOLIC PANEL - Abnormal; Notable for the following components:   Sodium 134 (*)    Glucose, Bld 116 (*)    BUN 43 (*)    Calcium 8.3 (*)    Total Protein 5.0 (*)    Albumin 1.7 (*)  Alkaline Phosphatase 23 (*)    GFR, Estimated 60 (*)    All other components within normal limits  C-REACTIVE PROTEIN - Abnormal; Notable for the following components:   CRP 10.3 (*)    All other components within normal limits  D-DIMER, QUANTITATIVE - Abnormal; Notable for the following  components:   D-Dimer, Quant 2.01 (*)    All other components within normal limits  MAGNESIUM - Abnormal; Notable for the following components:   Magnesium 3.0 (*)    All other components within normal limits  BRAIN NATRIURETIC PEPTIDE - Abnormal; Notable for the following components:   B Natriuretic Peptide 931.4 (*)    All other components within normal limits  HEPARIN LEVEL (UNFRACTIONATED) - Abnormal; Notable for the following components:   Heparin Unfractionated 0.75 (*)    All other components within normal limits  GLUCOSE, CAPILLARY - Abnormal; Notable for the following components:   Glucose-Capillary 174 (*)    All other components within normal limits  GLUCOSE, CAPILLARY - Abnormal; Notable for the following components:   Glucose-Capillary 155 (*)    All other components within normal limits  GLUCOSE, CAPILLARY - Abnormal; Notable for the following components:   Glucose-Capillary 144 (*)    All other components within normal limits  CBC WITH DIFFERENTIAL/PLATELET - Abnormal; Notable for the following components:   WBC 14.5 (*)    RBC 3.08 (*)    Hemoglobin 9.3 (*)    HCT 28.9 (*)    RDW 17.7 (*)    Platelets 450 (*)    Neutro Abs 12.3 (*)    Lymphs Abs 0.4 (*)    Monocytes Absolute 1.3 (*)    Abs Immature Granulocytes 0.40 (*)    All other components within normal limits  COMPREHENSIVE METABOLIC PANEL - Abnormal; Notable for the following components:   Glucose, Bld 154 (*)    BUN 38 (*)    Calcium 8.0 (*)    Total Protein 4.9 (*)    Albumin 1.7 (*)    Alkaline Phosphatase 24 (*)    GFR, Estimated 51 (*)    All other components within normal limits  C-REACTIVE PROTEIN - Abnormal; Notable for the following components:   CRP 6.7 (*)    All other components within normal limits  D-DIMER, QUANTITATIVE - Abnormal; Notable for the following components:   D-Dimer, Quant 2.11 (*)    All other components within normal limits  BRAIN NATRIURETIC PEPTIDE - Abnormal;  Notable for the following components:   B Natriuretic Peptide 1,096.6 (*)    All other components within normal limits  HEPARIN LEVEL (UNFRACTIONATED) - Abnormal; Notable for the following components:   Heparin Unfractionated >1.10 (*)    All other components within normal limits  GLUCOSE, CAPILLARY - Abnormal; Notable for the following components:   Glucose-Capillary 103 (*)    All other components within normal limits  CBG MONITORING, ED - Abnormal; Notable for the following components:   Glucose-Capillary 133 (*)    All other components within normal limits  CBG MONITORING, ED - Abnormal; Notable for the following components:   Glucose-Capillary 142 (*)    All other components within normal limits  CBG MONITORING, ED - Abnormal; Notable for the following components:   Glucose-Capillary 250 (*)    All other components within normal limits  CBG MONITORING, ED - Abnormal; Notable for the following components:   Glucose-Capillary 241 (*)    All other components within normal limits  POC OCCULT BLOOD, ED -  Abnormal; Notable for the following components:   Fecal Occult Bld POSITIVE (*)    All other components within normal limits  CULTURE, BLOOD (ROUTINE X 2)  CULTURE, BLOOD (ROUTINE X 2)  MRSA NEXT GEN BY PCR, NASAL  EXPECTORATED SPUTUM ASSESSMENT W GRAM STAIN, RFLX TO RESP C  LACTIC ACID, PLASMA  LACTIC ACID, PLASMA  PROTIME-INR  APTT  PROCALCITONIN  LACTATE DEHYDROGENASE  TRIGLYCERIDES  MAGNESIUM  PHOSPHORUS  VITAMIN B12  FOLATE  MAGNESIUM  PROCALCITONIN  HEPARIN LEVEL (UNFRACTIONATED)  PROCALCITONIN  MAGNESIUM  CBC  OCCULT BLOOD X 1 CARD TO LAB, STOOL  CBC  PROCALCITONIN  CBC  TYPE AND SCREEN  PREPARE RBC (CROSSMATCH)   ____________________________________________  EKG  None  ____________________________________________  RADIOLOGY  CXR independently reviewed with infiltrate on the left.    ____________________________________________   PROCEDURES  Procedure(s) performed:   Procedures  CRITICAL CARE Performed by: Margette Fast Total critical care time: 35 minutes Critical care time was exclusive of separately billable procedures and treating other patients. Critical care was necessary to treat or prevent imminent or life-threatening deterioration. Critical care was time spent personally by me on the following activities: development of treatment plan with patient and/or surrogate as well as nursing, discussions with consultants, evaluation of patient's response to treatment, examination of patient, obtaining history from patient or surrogate, ordering and performing treatments and interventions, ordering and review of laboratory studies, ordering and review of radiographic studies, pulse oximetry and re-evaluation of patient's condition.  Nanda Quinton, MD Emergency Medicine  ____________________________________________   INITIAL IMPRESSION / ASSESSMENT AND PLAN / ED COURSE  Pertinent labs & imaging results that were available during my care of the patient were reviewed by me and considered in my medical decision making (see chart for details).   This patient is Presenting for Evaluation of SOB and cough, which does require a range of treatment options, and is a complaint that involves a high risk of morbidity and mortality.  The Differential Diagnoses include CAP, pleural effusion, COVID, Flu, PE, ACS.  Medical Decision Making: Summary:  Patient presents emergency department with cough and congestion.  Symptoms progressing over the past week but not responding to antibiotics.  Hypoxemia noted and able to transition to nasal cannula.  Patient's COVID test has come back positive with likely infiltrate on chest x-ray per my independent review and agree with radiology interpretation.     Patient Vitals for the past 24 hrs:  BP Temp Temp src Pulse Resp SpO2 Weight   12/01/21 0600 (!) 142/67 97.6 F (36.4 C) Oral 88 20 100 % --  12/01/21 0500 -- -- -- -- -- -- 44.7 kg  11/30/21 2007 -- -- -- -- -- 100 % --  11/30/21 2000 (!) 118/46 97.6 F (36.4 C) Oral 97 20 100 % --  11/30/21 1800 128/63 -- -- 80 (!) 24 100 % --  11/30/21 1515 (!) 112/57 (!) 97 F (36.1 C) -- 77 (!) 25 100 % --  11/30/21 1318 (!) 142/55 97.6 F (36.4 C) Oral 66 (!) 26 100 % --  11/30/21 1200 -- -- -- -- -- 100 % --  11/30/21 1154 (!) 153/74 97.6 F (36.4 C) Oral 84 (!) 33 99 % --  11/30/21 1119 -- -- -- -- -- 99 % --  11/30/21 0820 -- -- -- -- -- 99 % --  11/30/21 0815 -- -- -- -- -- 96 % --    7:16 AM Reevaluation with update and discussion with patient she  is looking well and requiring less O2.     7:16 AM-Consult complete with the Hospitalist.   Discussed patient's case with [facility] to request admission. Patient and family (if present) updated with plan. Care transferred to [facility] service.  I reviewed all nursing notes, vitals, pertinent old records, EKGs, labs, imaging (as available).         I did Additional Historical Information from family. Patient had a tele-health visit with PNA diagnosis and abx last week.   I decided to review pertinent External Data, and in summary no prior ECHO for review.    Clinical Laboratory Tests Ordered, included CBC, Metabolic panel, and COVID . Review indicates COVID positive. Positive leukocytosis. Hyperglycemia without anion gap.   Radiologic Tests Ordered, included CXR.  I independently Visualized: CXR Images, which show Left sided infiltrate.    Cardiac Monitor Tracing which shows NSR   ____________________________________________  FINAL CLINICAL IMPRESSION(S) / ED DIAGNOSES  Final diagnoses:  Acute respiratory failure with hypoxia ([place])  COVID-19     MEDICATIONS GIVEN DURING THIS VISIT:  Medications  methazolamide (NEPTAZANE) tablet 25 mg (25 mg Oral Given 11/30/21 2123)  albuterol (PROVENTIL) (2.5 MG/3ML)  0.083% nebulizer solution 2.5 mg (2.5 mg Nebulization Given 12/01/21 0623)  umeclidinium bromide (INCRUSE ELLIPTA) 62.5 MCG/ACT 1 puff (1 puff Inhalation Given 11/30/21 0945)  polyvinyl alcohol (LIQUIFILM TEARS) 1.4 % ophthalmic solution 1 drop (has no administration in time range)  brimonidine (ALPHAGAN) 0.15 % ophthalmic solution 1 drop (1 drop Both Eyes Given 11/30/21 2119)  cycloSPORINE (RESTASIS) 0.05 % ophthalmic emulsion 1 drop (1 drop Both Eyes Given 11/30/21 2120)  dorzolamide-timolol (COSOPT) 22.3-6.8 MG/ML ophthalmic solution 1 drop (1 drop Both Eyes Given 11/30/21 2120)  pilocarpine (PILOCAR) 2 % ophthalmic solution 1 drop (1 drop Both Eyes Given 11/30/21 2119)  Netarsudil-Latanoprost 0.02-0.005% (Rocklatan) eye drops - patient supplied (1 drop Both Eyes Given 11/30/21 2122)  sodium chloride flush (NS) 0.9 % injection 3 mL (3 mLs Intravenous Given 11/30/21 2134)  acetaminophen (TYLENOL) tablet 650 mg (has no administration in time range)    Or  acetaminophen (TYLENOL) suppository 650 mg (has no administration in time range)  polyethylene glycol (MIRALAX / GLYCOLAX) packet 17 g (has no administration in time range)  guaiFENesin-dextromethorphan (ROBITUSSIN DM) 100-10 MG/5ML syrup 10 mL (10 mLs Oral Given 12/01/21 0607)  chlorpheniramine-HYDROcodone (TUSSIONEX) 10-8 MG/5ML suspension 5 mL (5 mLs Oral Given 11/28/21 2002)  ascorbic acid (VITAMIN C) tablet 500 mg (500 mg Oral Given 11/30/21 0939)  zinc sulfate capsule 220 mg (220 mg Oral Given 11/30/21 0939)  diclofenac Sodium (VOLTAREN) 1 % topical gel 2 g (has no administration in time range)  melatonin tablet 3 mg (3 mg Oral Given by Other 11/30/21 2113)  insulin aspart (novoLOG) injection 0-9 Units (0 Units Subcutaneous Not Given 12/01/21 0631)  remdesivir 200 mg in sodium chloride 0.9% 250 mL IVPB (0 mg Intravenous Stopped 11/28/21 0027)    Followed by  remdesivir 100 mg in sodium chloride 0.9 % 100 mL IVPB (100 mg Intravenous New Bag/Given 11/30/21 1712)   pantoprazole (PROTONIX) EC tablet 40 mg (40 mg Oral Given 11/30/21 1647)  Ipratropium-Albuterol (COMBIVENT) respimat 1 puff (1 puff Inhalation Not Given 12/01/21 0403)  dexamethasone (DECADRON) injection 10 mg (10 mg Intravenous Given 11/30/21 0948)  piperacillin-tazobactam (ZOSYN) IVPB 3.375 g (3.375 g Intravenous New Bag/Given 12/01/21 0029)  traMADol (ULTRAM) tablet 50 mg (50 mg Oral Given 12/01/21 0602)  feeding supplement (ENSURE ENLIVE / ENSURE PLUS) liquid 237 mL (237 mLs Oral  Patient Refused/Not Given 11/30/21 1654)  0.9 %  sodium chloride infusion ( Intravenous Stopped 11/29/21 2300)  apixaban (ELIQUIS) tablet 10 mg (10 mg Oral Given by Other 11/30/21 2112)    Followed by  apixaban (ELIQUIS) tablet 5 mg (has no administration in time range)  fentaNYL (SUBLIMAZE) injection 12.5 mcg (12.5 mcg Intravenous Given 12/01/21 0613)  lactated ringers bolus 500 mL (0 mLs Intravenous Stopped 11/27/21 1910)  dexamethasone (DECADRON) injection 10 mg (10 mg Intravenous Given 11/27/21 2235)  vancomycin (VANCOCIN) IVPB 1000 mg/200 mL premix (0 mg Intravenous Stopped 11/28/21 1032)  piperacillin-tazobactam (ZOSYN) IVPB 3.375 g (0 g Intravenous Stopped 11/28/21 0956)  0.9 %  sodium chloride infusion (Manually program via Guardrails IV Fluids) (0 mLs Intravenous Stopped 11/28/21 1449)  furosemide (LASIX) injection 40 mg (40 mg Intravenous Given 11/28/21 1307)  iohexol (OMNIPAQUE) 350 MG/ML injection 70 mL (70 mLs Intravenous Contrast Given 11/28/21 1151)  heparin bolus via infusion 2,000 Units (2,000 Units Intravenous Bolus from Bag 11/28/21 1503)  furosemide (LASIX) injection 40 mg (40 mg Intravenous Given 11/29/21 1042)  magnesium sulfate IVPB 2 g 50 mL (0 g Intravenous Stopped 11/30/21 0000)  furosemide (LASIX) injection 60 mg (60 mg Intravenous Given 11/30/21 1318)  loperamide (IMODIUM) capsule 4 mg (4 mg Oral Given by Other 11/30/21 2113)     Note:  This document was prepared using Dragon voice recognition software and may include  unintentional dictation errors.  Nanda Quinton, MD, [facility] Emergency Medicine    Demisha Nokes, [facility], MD 12/01/21 [phone]

## 2021-11-27 NOTE — ED Triage Notes (Signed)
Patient arrived via [facility] from home with complaints of shortness of breath and cough. Per EMS patient was diagnosed with pneumonia last week and treated with antibiotics at home and last dose was yesterday. Today patient was complaining of chest pain with productive of yellow sputum and subjective fever. EMS reports low saturation 85% on room air initially and bp 90/palpated. Patient given 48ml normal saline and their last bp 122/80 94% on non-rebreather. Patient is confused at baseline per EMS.
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
## 2021-11-28 ENCOUNTER — Inpatient Hospital Stay (HOSPITAL_COMMUNITY): Payer: Medicare Other

## 2021-11-28 ENCOUNTER — Observation Stay (HOSPITAL_COMMUNITY): Payer: Medicare Other

## 2021-11-28 DIAGNOSIS — R059 Cough, unspecified: Secondary | ICD-10-CM | POA: Diagnosis not present

## 2021-11-28 DIAGNOSIS — J90 Pleural effusion, not elsewhere classified: Secondary | ICD-10-CM | POA: Diagnosis not present

## 2021-11-28 DIAGNOSIS — R0602 Shortness of breath: Secondary | ICD-10-CM | POA: Diagnosis present

## 2021-11-28 DIAGNOSIS — I959 Hypotension, unspecified: Secondary | ICD-10-CM | POA: Diagnosis not present

## 2021-11-28 DIAGNOSIS — Z87891 Personal history of nicotine dependence: Secondary | ICD-10-CM | POA: Diagnosis not present

## 2021-11-28 DIAGNOSIS — I351 Nonrheumatic aortic (valve) insufficiency: Secondary | ICD-10-CM | POA: Diagnosis present

## 2021-11-28 DIAGNOSIS — R54 Age-related physical debility: Secondary | ICD-10-CM | POA: Diagnosis present

## 2021-11-28 DIAGNOSIS — H409 Unspecified glaucoma: Secondary | ICD-10-CM | POA: Diagnosis present

## 2021-11-28 DIAGNOSIS — R7989 Other specified abnormal findings of blood chemistry: Secondary | ICD-10-CM | POA: Diagnosis not present

## 2021-11-28 DIAGNOSIS — I82411 Acute embolism and thrombosis of right femoral vein: Secondary | ICD-10-CM | POA: Diagnosis present

## 2021-11-28 DIAGNOSIS — J9601 Acute respiratory failure with hypoxia: Secondary | ICD-10-CM | POA: Diagnosis not present

## 2021-11-28 DIAGNOSIS — H3530 Unspecified macular degeneration: Secondary | ICD-10-CM | POA: Diagnosis present

## 2021-11-28 DIAGNOSIS — I5031 Acute diastolic (congestive) heart failure: Secondary | ICD-10-CM | POA: Diagnosis not present

## 2021-11-28 DIAGNOSIS — I517 Cardiomegaly: Secondary | ICD-10-CM | POA: Diagnosis not present

## 2021-11-28 DIAGNOSIS — I5033 Acute on chronic diastolic (congestive) heart failure: Secondary | ICD-10-CM | POA: Diagnosis present

## 2021-11-28 DIAGNOSIS — Z8051 Family history of malignant neoplasm of kidney: Secondary | ICD-10-CM | POA: Diagnosis not present

## 2021-11-28 DIAGNOSIS — I2721 Secondary pulmonary arterial hypertension: Secondary | ICD-10-CM | POA: Diagnosis present

## 2021-11-28 DIAGNOSIS — J159 Unspecified bacterial pneumonia: Secondary | ICD-10-CM | POA: Diagnosis present

## 2021-11-28 DIAGNOSIS — D509 Iron deficiency anemia, unspecified: Secondary | ICD-10-CM | POA: Diagnosis present

## 2021-11-28 DIAGNOSIS — Z803 Family history of malignant neoplasm of breast: Secondary | ICD-10-CM | POA: Diagnosis not present

## 2021-11-28 DIAGNOSIS — Z515 Encounter for palliative care: Secondary | ICD-10-CM | POA: Diagnosis not present

## 2021-11-28 DIAGNOSIS — U071 COVID-19: Secondary | ICD-10-CM | POA: Diagnosis present

## 2021-11-28 DIAGNOSIS — Z7401 Bed confinement status: Secondary | ICD-10-CM | POA: Diagnosis not present

## 2021-11-28 DIAGNOSIS — Z7189 Other specified counseling: Secondary | ICD-10-CM | POA: Diagnosis not present

## 2021-11-28 DIAGNOSIS — E8809 Other disorders of plasma-protein metabolism, not elsewhere classified: Secondary | ICD-10-CM | POA: Diagnosis present

## 2021-11-28 DIAGNOSIS — J9621 Acute and chronic respiratory failure with hypoxia: Secondary | ICD-10-CM | POA: Diagnosis present

## 2021-11-28 DIAGNOSIS — I130 Hypertensive heart and chronic kidney disease with heart failure and stage 1 through stage 4 chronic kidney disease, or unspecified chronic kidney disease: Secondary | ICD-10-CM | POA: Diagnosis present

## 2021-11-28 DIAGNOSIS — E1122 Type 2 diabetes mellitus with diabetic chronic kidney disease: Secondary | ICD-10-CM | POA: Diagnosis present

## 2021-11-28 DIAGNOSIS — N1831 Chronic kidney disease, stage 3a: Secondary | ICD-10-CM | POA: Diagnosis present

## 2021-11-28 DIAGNOSIS — Z743 Need for continuous supervision: Secondary | ICD-10-CM | POA: Diagnosis not present

## 2021-11-28 DIAGNOSIS — R0603 Acute respiratory distress: Secondary | ICD-10-CM | POA: Diagnosis not present

## 2021-11-28 DIAGNOSIS — J189 Pneumonia, unspecified organism: Secondary | ICD-10-CM | POA: Diagnosis not present

## 2021-11-28 DIAGNOSIS — R627 Adult failure to thrive: Secondary | ICD-10-CM | POA: Diagnosis present

## 2021-11-28 DIAGNOSIS — I2699 Other pulmonary embolism without acute cor pulmonale: Secondary | ICD-10-CM | POA: Diagnosis present

## 2021-11-28 DIAGNOSIS — J9811 Atelectasis: Secondary | ICD-10-CM | POA: Diagnosis not present

## 2021-11-28 DIAGNOSIS — Z66 Do not resuscitate: Secondary | ICD-10-CM | POA: Diagnosis present

## 2021-11-28 DIAGNOSIS — J1282 Pneumonia due to coronavirus disease 2019: Secondary | ICD-10-CM | POA: Diagnosis present

## 2021-11-28 LAB — CBC
HCT: 29.6 % — ABNORMAL LOW (ref 36.0–46.0)
Hemoglobin: 9 g/dL — ABNORMAL LOW (ref 12.0–15.0)
MCH: 29.7 pg (ref 26.0–34.0)
MCHC: 30.4 g/dL (ref 30.0–36.0)
MCV: 97.7 fL (ref 80.0–100.0)
Platelets: 351 10*3/uL (ref 150–400)
RBC: 3.03 MIL/uL — ABNORMAL LOW (ref 3.87–5.11)
RDW: 18.5 % — ABNORMAL HIGH (ref 11.5–15.5)
WBC: 14.2 10*3/uL — ABNORMAL HIGH (ref 4.0–10.5)
nRBC: 0 % (ref 0.0–0.2)

## 2021-11-28 LAB — CBG MONITORING, ED
Glucose-Capillary: 133 mg/dL — ABNORMAL HIGH (ref 70–99)
Glucose-Capillary: 142 mg/dL — ABNORMAL HIGH (ref 70–99)
Glucose-Capillary: 250 mg/dL — ABNORMAL HIGH (ref 70–99)
Glucose-Capillary: 241 mg/dL — ABNORMAL HIGH (ref 70–99)

## 2021-11-28 LAB — COMPREHENSIVE METABOLIC PANEL
ALT: 8 U/L (ref 0–44)
AST: 18 U/L (ref 15–41)
Albumin: 1.7 g/dL — ABNORMAL LOW (ref 3.5–5.0)
Alkaline Phosphatase: 34 U/L — ABNORMAL LOW (ref 38–126)
Anion gap: 12 (ref 5–15)
BUN: 44 mg/dL — ABNORMAL HIGH (ref 8–23)
CO2: 20 mmol/L — ABNORMAL LOW (ref 22–32)
Calcium: 8.4 mg/dL — ABNORMAL LOW (ref 8.9–10.3)
Chloride: 103 mmol/L (ref 98–111)
Creatinine, Ser: 0.91 mg/dL (ref 0.44–1.00)
GFR, Estimated: 57 mL/min — ABNORMAL LOW (ref >60)
Glucose, Bld: 139 mg/dL — ABNORMAL HIGH (ref 70–99)
Potassium: 4.3 mmol/L (ref 3.5–5.1)
Sodium: 135 mmol/L (ref 135–145)
Total Bilirubin: 0.7 mg/dL (ref 0.3–1.2)
Total Protein: 4.7 g/dL — ABNORMAL LOW (ref 6.5–8.1)

## 2021-11-28 LAB — CBC WITH DIFFERENTIAL/PLATELET
Abs Immature Granulocytes: 0 10*3/uL (ref 0.00–0.07)
Band Neutrophils: 5 %
Basophils Absolute: 0 10*3/uL (ref 0.0–0.1)
Basophils Relative: 0 %
Eosinophils Absolute: 0 10*3/uL (ref 0.0–0.5)
Eosinophils Relative: 0 %
HCT: 24.5 % — ABNORMAL LOW (ref 36.0–46.0)
Hemoglobin: 7.5 g/dL — ABNORMAL LOW (ref 12.0–15.0)
Lymphocytes Relative: 2 %
Lymphs Abs: 0.3 10*3/uL — ABNORMAL LOW (ref 0.7–4.0)
MCH: 31.5 pg (ref 26.0–34.0)
MCHC: 30.6 g/dL (ref 30.0–36.0)
MCV: 102.9 fL — ABNORMAL HIGH (ref 80.0–100.0)
Monocytes Absolute: 0.5 10*3/uL (ref 0.1–1.0)
Monocytes Relative: 3 %
Neutro Abs: 15.2 10*3/uL — ABNORMAL HIGH (ref 1.7–7.7)
Neutrophils Relative %: 90 %
Platelets: 328 10*3/uL (ref 150–400)
RBC: 2.38 MIL/uL — ABNORMAL LOW (ref 3.87–5.11)
RDW: 16.3 % — ABNORMAL HIGH (ref 11.5–15.5)
WBC: 16 10*3/uL — ABNORMAL HIGH (ref 4.0–10.5)
nRBC: 0 % (ref 0.0–0.2)

## 2021-11-28 LAB — MRSA NEXT GEN BY PCR, NASAL: MRSA by PCR Next Gen: NOT DETECTED

## 2021-11-28 LAB — MAGNESIUM: Magnesium: 1.9 mg/dL (ref 1.7–2.4)

## 2021-11-28 LAB — FERRITIN: Ferritin: 491 ng/mL — ABNORMAL HIGH (ref 11–307)

## 2021-11-28 LAB — ECHOCARDIOGRAM LIMITED
Height: 63 in
S' Lateral: 2.6 cm
Weight: 1520 oz

## 2021-11-28 LAB — PHOSPHORUS: Phosphorus: 3.9 mg/dL (ref 2.5–4.6)

## 2021-11-28 LAB — PREPARE RBC (CROSSMATCH)

## 2021-11-28 LAB — C-REACTIVE PROTEIN: CRP: 24.8 mg/dL — ABNORMAL HIGH (ref <1.0)

## 2021-11-28 LAB — BRAIN NATRIURETIC PEPTIDE: B Natriuretic Peptide: 675 pg/mL — ABNORMAL HIGH (ref 0.0–100.0)

## 2021-11-28 LAB — D-DIMER, QUANTITATIVE: D-Dimer, Quant: 3.65 ug/mL-FEU — ABNORMAL HIGH (ref 0.00–0.50)

## 2021-11-28 LAB — POC OCCULT BLOOD, ED: Fecal Occult Bld: POSITIVE — AB

## 2021-11-28 MED ORDER — FUROSEMIDE 10 MG/ML IJ SOLN
40.0000 mg | Freq: Once | INTRAMUSCULAR | Status: AC
  Administered 2021-11-28: 40 mg via INTRAVENOUS
  Filled 2021-11-28: qty 4

## 2021-11-28 MED ORDER — DEXAMETHASONE SODIUM PHOSPHATE 10 MG/ML IJ SOLN
10.0000 mg | INTRAMUSCULAR | Status: DC
  Administered 2021-11-28 – 2021-11-30 (×3): 10 mg via INTRAVENOUS
  Filled 2021-11-28 (×3): qty 1

## 2021-11-28 MED ORDER — PANTOPRAZOLE SODIUM 40 MG PO TBEC
40.0000 mg | DELAYED_RELEASE_TABLET | Freq: Two times a day (BID) | ORAL | Status: DC
  Administered 2021-11-28 – 2021-12-02 (×9): 40 mg via ORAL
  Filled 2021-11-28 (×9): qty 1

## 2021-11-28 MED ORDER — LACTATED RINGERS IV SOLN: INTRAVENOUS | Status: DC

## 2021-11-28 MED ORDER — PIPERACILLIN-TAZOBACTAM 3.375 G IVPB 30 MIN
3.3750 g | Freq: Once | INTRAVENOUS | Status: AC
  Administered 2021-11-28: 3.375 g via INTRAVENOUS
  Filled 2021-11-28: qty 50

## 2021-11-28 MED ORDER — SODIUM CHLORIDE 0.9% IV SOLUTION: Freq: Once | INTRAVENOUS | Status: AC

## 2021-11-28 MED ORDER — IPRATROPIUM-ALBUTEROL 20-100 MCG/ACT IN AERS
1.0000 | INHALATION_SPRAY | Freq: Four times a day (QID) | RESPIRATORY_TRACT | Status: DC
  Administered 2021-11-28 – 2021-12-02 (×13): 1 via RESPIRATORY_TRACT
  Filled 2021-11-28: qty 4

## 2021-11-28 MED ORDER — VANCOMYCIN HCL IN DEXTROSE 1-5 GM/200ML-% IV SOLN
1000.0000 mg | Freq: Once | INTRAVENOUS | Status: AC
  Administered 2021-11-28: 1000 mg via INTRAVENOUS
  Filled 2021-11-28: qty 200

## 2021-11-28 MED ORDER — HEPARIN BOLUS VIA INFUSION
2000.0000 [IU] | Freq: Once | INTRAVENOUS | Status: AC
  Administered 2021-11-28: 2000 [IU] via INTRAVENOUS
  Filled 2021-11-28: qty 2000

## 2021-11-28 MED ORDER — HEPARIN (PORCINE) 25000 UT/250ML-% IV SOLN
800.0000 [IU]/h | INTRAVENOUS | Status: DC
  Administered 2021-11-28: 15:00:00 650 [IU]/h via INTRAVENOUS
  Administered 2021-11-29: 20:00:00 950 [IU]/h via INTRAVENOUS
  Filled 2021-11-28 (×2): qty 250

## 2021-11-28 MED ORDER — VANCOMYCIN HCL 750 MG/150ML IV SOLN: 750.0000 mg | INTRAVENOUS | Status: DC

## 2021-11-28 MED ORDER — ENSURE ENLIVE PO LIQD
237.0000 mL | Freq: Two times a day (BID) | ORAL | Status: DC
  Administered 2021-11-28 – 2021-12-02 (×5): 237 mL via ORAL
  Filled 2021-11-28: qty 237

## 2021-11-28 MED ORDER — TRAMADOL HCL 50 MG PO TABS
50.0000 mg | ORAL_TABLET | ORAL | Status: DC | PRN
  Administered 2021-11-28 – 2021-12-01 (×9): 50 mg via ORAL
  Filled 2021-11-28 (×11): qty 1

## 2021-11-28 MED ORDER — PIPERACILLIN-TAZOBACTAM 3.375 G IVPB
3.3750 g | Freq: Three times a day (TID) | INTRAVENOUS | Status: DC
  Administered 2021-11-28 – 2021-12-02 (×12): 3.375 g via INTRAVENOUS
  Filled 2021-11-28 (×12): qty 50

## 2021-11-28 MED ORDER — IOHEXOL 350 MG/ML SOLN
70.0000 mL | Freq: Once | INTRAVENOUS | Status: AC | PRN
  Administered 2021-11-28: 70 mL via INTRAVENOUS

## 2021-11-28 NOTE — ED Notes (Signed)
Pt daughter reports that patient takes Tramadol every 3-4 hours scheduled at home. Currently ordered Q6H PRN. Message sent to MD regarding same.

## 2021-11-28 NOTE — Progress Notes (Signed)
Bilateral lower extremity venous duplex has been completed. Preliminary results can be found in CV Proc through chart review.  Results were given to the patient's nurse, [place].  11/28/21 1:39 PM Shelia Holt RVT

## 2021-11-28 NOTE — ED Notes (Signed)
Pt transported to CT via stretcher at this time.  

## 2021-11-28 NOTE — Evaluation (Signed)
Physical Therapy Evaluation Patient Details Name: Shelia Holt MRN: [id number] DOB: [birth year] Today's Date: 11/28/2021  History of Present Illness  88 y.o. female with medical history significant of glaucoma, macular degeneration, carotid stenosis, anemia, CKD 3, COPD, diabetes, hypertension, hyperlipidemia, prolactinoma, GERD, IBS admitted with acute hypoxic respiratory failure with sats below 85% on RA.  Positive for pneumonia with Covid-19 infection.  Clinical Impression  Patient presents with decreased mobility due to weakness, decreased activity tolerance, decreased cardiorespiratory endurance and she will benefit from skilled PT in the acute setting and may need follow up HHPT at d/c.  Currently tolerated EOB activity with initial min A for balance progressing to supervision.  Did not feel up to standing today, but BP remained stable and RR max 34 with 3L O2 [place].  She has significant arthritic pain per daughter, but mobilized with A of 2 for safety and energy conservation despite obvious pain with movement.  Feel she may be able to return home with 24 hour assist, 3:1 and potentially HHPT/HH aide.       Recommendations for follow up therapy are one component of a multi-disciplinary discharge planning process, led by the attending physician.  Recommendations may be updated based on patient status, additional functional criteria and insurance authorization.  Follow Up Recommendations Home health PT [facility] aide)    Assistance Recommended at Discharge Frequent or constant Supervision/Assistance  Patient can return home with the following  A lot of help with bathing/dressing/bathroom;A lot of help with walking and/or transfers;Help with stairs or ramp for entrance;Direct supervision/assist for medications management;Assist for transportation    Equipment Recommendations BSC/3in1  Recommendations for Other Services       Functional Status Assessment Patient has had a recent decline in their  functional status and/or demonstrates limited ability to make significant improvements in function in a reasonable and predictable amount of time     Precautions / Restrictions Precautions Precautions: Fall Precaution Comments: watch O2, RR Restrictions Weight Bearing Restrictions: No      Mobility  Bed Mobility Overal bed mobility: Needs Assistance Bed Mobility: Supine to Sit;Sit to Supine     Supine to sit: Mod assist;HOB elevated;+2 for safety/equipment Sit to supine: Mod assist;+2 for safety/equipment;HOB elevated   General bed mobility comments: assist for legs to EOB and lifting trunk, to supine assist for legs onto bed and positioning trunk    Transfers                   General transfer comment: NT due to weakness, recieving blood and new DVT found R LE (already on Lovenox)    Ambulation/Gait                  Stairs            Wheelchair Mobility    Modified Rankin (Stroke Patients Only)       Balance Overall balance assessment: Needs assistance Sitting-balance support: Feet unsupported Sitting balance-Leahy Scale: Poor Sitting balance - Comments: initially needing support for balance, progressed as up at EOB to sitting without external support and drinking Ensure without UE support                                     Pertinent Vitals/Pain Pain Assessment: Faces Faces Pain Scale: Hurts little more Pain Location: generalized with mobility Pain Descriptors / Indicators: Grimacing;Guarding;Aching Pain Intervention(s): Monitored during session;Repositioned    Home Living  Family/patient expects to be discharged to:: Private residence Living Arrangements: Alone Available Help at Discharge: Family;Available PRN/intermittently Type of Home: House Home Access: Stairs to enter       Home Layout: One level Home Equipment: Conservation officer, nature (2 wheels)      Prior Function Prior Level of Function : Needs assist        Physical Assist : ADLs (physical) Mobility (physical): Bed mobility;Transfers;Gait;Stairs ADLs (physical): Dressing;Bathing;IADLs Mobility Comments: using RW only about a year or so since last fall ADLs Comments: daughter assists with bathing/dressing and provides meals     Hand Dominance   Dominant Hand: Right    Extremity/Trunk Assessment   Upper Extremity Assessment Upper Extremity Assessment: Defer to OT evaluation RUE Deficits / Details: generally weak throughout, unable to lift arms over head. elbow, wrist and hand ROM is WFL. bony process at R wrist was red and tender. used functionally to drink from cup with a straw RUE Coordination: decreased fine motor;decreased gross motor LUE Deficits / Details: generally weak throughout, unable to lift above head. elbow, wrist and hand ROM is WFL. LUE Coordination: decreased fine motor;decreased gross motor    Lower Extremity Assessment Lower Extremity Assessment: RLE deficits/detail;LLE deficits/detail RLE Deficits / Details: AAROM WFL strength hip flexion 3-/5, knee extension 4-/5; ankle DF 4/5 RLE Sensation: history of peripheral neuropathy;decreased light touch LLE Deficits / Details: AAROM WFL strength hip flexion 3-/5, knee extension 4-/5; ankle DF 4/5 LLE Sensation: history of peripheral neuropathy;decreased light touch    Cervical / Trunk Assessment Cervical / Trunk Assessment: Kyphotic  Communication   Communication: No difficulties  Cognition Arousal/Alertness: Awake/alert Behavior During Therapy: Flat affect Overall Cognitive Status: History of cognitive impairments - at baseline Area of Impairment: Orientation                 Orientation Level: Disoriented to;Time             General Comments: daughter reports normally disoriented to time Functional Status Assessment: Patient has not had a recent decline in their functional status      General Comments General comments (skin integrity, edema, etc.):  RR as high as 34, on 3L O2 throughout SpO2 maintianing in 90's. BP stable    Exercises     Assessment/Plan    PT Assessment Patient needs continued PT services  PT Problem List Decreased strength;Decreased mobility;Decreased activity tolerance;Decreased balance;Cardiopulmonary status limiting activity;Pain;Decreased knowledge of use of DME       PT Treatment Interventions DME instruction;Therapeutic activities;Patient/family education;Gait training;Balance training;Functional mobility training    PT Goals (Current goals can be found in the Care Plan section)  Acute Rehab PT Goals Patient Stated Goal: to go home PT Goal Formulation: With patient/family Time For Goal Achievement: 12/12/21 Potential to Achieve Goals: Good    Frequency Min 3X/week     Co-evaluation PT/OT/SLP Co-Evaluation/Treatment: Yes Reason for Co-Treatment: For patient/therapist safety;To address functional/ADL transfers PT goals addressed during session: Balance;Mobility/safety with mobility OT goals addressed during session: ADL's and self-care       AM-PAC PT "6 Clicks" Mobility  Outcome Measure Help needed turning from your back to your side while in a flat bed without using bedrails?: A Lot Help needed moving from lying on your back to sitting on the side of a flat bed without using bedrails?: A Lot Help needed moving to and from a bed to a chair (including a wheelchair)?: Total Help needed standing up from a chair using your arms (e.g., wheelchair or bedside chair)?:  Total Help needed to walk in hospital room?: Total Help needed climbing 3-5 steps with a railing? : Total 6 Click Score: 8    End of Session   Activity Tolerance: Patient limited by fatigue Patient left: in bed;with call bell/phone within reach;with family/visitor present Nurse Communication: Mobility status PT Visit Diagnosis: Muscle weakness (generalized) (M62.81);Difficulty in walking, not elsewhere classified (R26.2)    Time:  4650-3546 PT Time Calculation (min) (ACUTE ONLY): 27 min   Charges:   PT Evaluation $PT Eval Moderate Complexity: 1 Mod          Cyndi Alyzabeth Pontillo, PT Acute Rehabilitation Services FKCLE:[phone] Office:[phone] 11/28/2021   Reginia Naas 11/28/2021, 2:59 PM

## 2021-11-28 NOTE — Progress Notes (Signed)
ANTICOAGULATION CONSULT NOTE - Initial Consult  Pharmacy Consult for Heparin Indication: pulmonary embolus  Allergies  Allergen Reactions   Celecoxib Palpitations    palpitations   Rofecoxib Palpitations    palpitations palpitations   Gabapentin Other (See Comments)    confusion   Levaquin [Levofloxacin In D5w] Other (See Comments)    aching   Levofloxacin Other (See Comments)    Aching, Muscle weakness Other reaction(s): Other (See Comments) Aching aching Muscle weakness   Nortriptyline Other (See Comments)    Change in Behavior Other reaction(s): Other (See Comments) Change in Behavior   Nortriptyline Hcl Other (See Comments)    Change in behavior Change in behavior    Patient Measurements: Height: 5\' 3"  (160 cm) Weight: 43.1 kg (95 lb) IBW/kg (Calculated) : 52.4 Heparin Dosing Weight: TBW  Vital Signs: Temp: 97.4 F (36.3 C) (01/04 1230) Temp Source: Oral (01/04 1230) BP: 133/69 (01/04 1315) Pulse Rate: 97 (01/04 1315)  Labs: Recent Labs    11/27/21 1731 11/28/21 0201  HGB 8.4* 7.5*  HCT 27.3* 24.5*  PLT 375 328  APTT 30  --   LABPROT 14.5  --   INR 1.1  --   CREATININE 1.05* 0.91    Estimated Creatinine Clearance: 24 mL/min (by C-G formula based on SCr of 0.91 mg/dL).   Medical History: Past Medical History:  Diagnosis Date   Anemia    Carotid atherosclerosis 09/23/2012   Doppler: 50-69% left prox.,70-99% left bulb,,0-49% right bulb & prox.   Cerebral vascular disease    Chronic kidney disease    COPD (chronic obstructive pulmonary disease) (HCC)    Degenerative disc disease, cervical    Diabetes mellitus without complication (HCC)    Dizziness    Falls    GERD (gastroesophageal reflux disease)    Glaucoma    History of shingles    Hyperlipidemia    Hyperprolactinemia (HCC)    Hypertension    Insomnia    Irritable bowel syndrome    Mitral valve regurgitation    Osteoarthritis    Osteopenia    Scoliosis    Seasonal allergies     Spinal stenosis    Vaginal wall prolapse    Assessment: 37 YOF presenting with SOB, COVID +, elevated D-dimer and CT angio chest with possible embolic disease.  Lovenox 30mg  DVT ppx dose given 1/3 @2230   Goal of Therapy:  Heparin level 0.3-0.7 units/ml Monitor platelets by anticoagulation protocol: Yes   Plan:  Heparin 2000 units IV x 1, and gtt at 650 units/hr F/u 8 hour heparin level F/u doppler and further VTE workup  Bertis Ruddy, PharmD Clinical Pharmacist ED Pharmacist Phone # [phone] 11/28/2021 1:38 PM
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
## 2021-11-28 NOTE — Progress Notes (Signed)
PROGRESS NOTE                                                                                                                                                                                                             Patient Demographics:    Shelia Holt, is a 88 y.o. female, DOB - 03/22/1922, JKK:[id number]  Outpatient Primary MD for the patient is Deland Pretty, MD    LOS - 0  Admit date - 11/27/2021    Chief Complaint  Patient presents with   Shortness of Breath    Productive cough       Brief Narrative (HPI from H&P)   88 y.o. female with medical history significant of glaucoma, macular degeneration, carotid stenosis, anemia, CKD 3, COPD, diabetes, hypertension, hyperlipidemia, prolactinoma, GERD, IBS who presents with ongoing shortness of breath and cough.  In the ER she was diagnosed with acute hypoxic respiratory failure with sats below 85% on room air.  She was diagnosed with pneumonia due to bacterial infection along with COVID-19 infection and admitted to the hospital.   Subjective:    Shelia Holt today has, No headache, No chest pain, No abdominal pain - No Nausea, No new weakness tingling or numbness, +ve productive cough and shortness of breath.   Assessment  & Plan :     Acute Hypoxic Resp. Failure due to bacterial pneumonia along with COVID-19 infection - she has a productive cough along with leukocytosis and elevated procalcitonin, she has already failed outpatient azithromycin 5-day course.  Currently she seems to have severe bacterial pneumonia along with COVID-19 pneumonitis as well.  Continue combination of steroids, vancomycin and Zosyn.  Overall quite tenuous.  Poor candidate for Actemra due to advanced age, concurrent bacterial infection.  Continue supportive care with I-S, flutter valve, oxygen and nebulizer treatments.  Monitor cultures closely.  Encouraged the patient to sit up in chair in the  daytime use I-S and flutter valve for pulmonary toiletry.  Will advance activity and titrate down oxygen as possible.  2.  Acute on chronic anemia.  Recently had hemorrhoidal bleed, she has required outpatient transfusions according to the daughter recently, will go ahead and transfuse 1 unit as anemia could be contributing to her hypoxia as well.  Twice daily PPI.   3.  Elevated D-dimer.  Could be due to inflammation from #1  above.  On Lovenox prophylactic dose, check leg ultrasound and CTA chest.  4.  Glaucoma and macular degeneration.  Continue home regimen of eyedrops and methazolamide.  5.  CKD 3A.  Baseline creatinine around 1.3.  Monitor.  6.  Deconditioning and weakness.  Due to advanced age.  At risk for delirium.  Family want.  Minimize narcotics and benzodiazepines.  Use as needed Haldol if she gets confused.  7.  History of CAD.  Chest pain-free.  Check echocardiogram due to respiratory failure, evaluate EF and wall motion.         Condition - Extremely Guarded  Family Communication  : Daughter bedside on 11/28/2021  Code Status : DNR  Consults  : None  PUD Prophylaxis : Protonix   Procedures  :     CTA Chest -   Leg [place] -       Disposition Plan  :    Status is: Inpt   DVT Prophylaxis  :    enoxaparin (LOVENOX) injection 30 mg Start: 11/27/21 2045    Lab Results  Component Value Date   PLT 328 11/28/2021    Diet :  Diet Order             Diet Heart Room service appropriate? Yes; Fluid consistency: Thin  Diet effective now                    Inpatient Medications  Scheduled Meds:  sodium chloride   Intravenous Once   vitamin C  500 mg Oral Daily   brimonidine  1 drop Both Eyes BID   cycloSPORINE  1 drop Both Eyes BID   dexamethasone (DECADRON) injection  10 mg Intravenous Q24H   dorzolamide-timolol  1 drop Both Eyes BID   enoxaparin (LOVENOX) injection  30 mg Subcutaneous Q24H   furosemide  40 mg Intravenous Once   insulin aspart   0-9 Units Subcutaneous TID WC   Ipratropium-Albuterol  1 puff Inhalation Q6H   melatonin  3 mg Oral QHS   methazolamide  25 mg Oral BID   Netarsudil-Latanoprost  1 drop Both Eyes QHS   pantoprazole  40 mg Oral BID AC   pilocarpine  1 drop Both Eyes TID   sodium chloride flush  3 mL Intravenous Q12H   umeclidinium bromide  1 puff Inhalation Daily   zinc sulfate  220 mg Oral Daily   Continuous Infusions:  piperacillin-tazobactam (ZOSYN)  IV     remdesivir 100 mg in NS 100 mL     [START ON 11/30/2021] vancomycin     PRN Meds:.acetaminophen **OR** acetaminophen, albuterol, chlorpheniramine-HYDROcodone, diclofenac Sodium, guaiFENesin-dextromethorphan, polyethylene glycol, polyvinyl alcohol, traMADol  Time Spent in minutes  30   Lala Lund M.D on 11/28/2021 at 11:00 AM  To page go to [email or web address]   [facility] -  Office  [phone]  See all Orders from today for further details    Objective:   Vitals:   11/28/21 0500 11/28/21 0800 11/28/21 0810 11/28/21 1041  BP: (!) 127/56 134/84  124/64  Pulse: 89 (!) 106 93 (!) 104  Resp: (!) 22 17 16  (!) 22  Temp:    98.6 F (37 C)  TempSrc:    Oral  SpO2: 100% 100% 100% 100%  Weight:      Height:        Wt Readings from Last 3 Encounters:  11/27/21 43.1 kg  09/14/21 45.4 kg  08/23/21 44.5 kg     Intake/Output Summary (Last 24  hours) at 11/28/2021 1100 Last data filed at 11/28/2021 1032 Gross per 24 hour  Intake 2018.47 ml  Output --  Net 2018.47 ml     Physical Exam  Awake Alert, No new F.N deficits, Normal affect [place].AT,PERRAL Supple Neck, No JVD,   Symmetrical Chest wall movement, Good air movement bilaterally, +ve rales RRR,No Gallops,Rubs or new Murmurs,  +ve B.Sounds, Abd Soft, No tenderness,   No Cyanosis, Clubbing or edema      Data Review:    CBC Recent Labs  Lab 11/27/21 1731 11/28/21 0201  WBC 17.2* 16.0*  HGB 8.4* 7.5*  HCT 27.3* 24.5*  PLT 375 328  MCV 103.0* 102.9*  MCH 31.7 31.5   MCHC 30.8 30.6  RDW 16.0* 16.3*  LYMPHSABS 0.7 0.3*  MONOABS 1.4* 0.5  EOSABS 0.2 0.0  BASOSABS 0.0 0.0    Electrolytes Recent Labs  Lab 11/27/21 1731 11/27/21 1919 11/27/21 1949 11/28/21 0201 11/28/21 0911  NA 135  --   --  135  --   K 4.0  --   --  4.3  --   CL 102  --   --  103  --   CO2 20*  --   --  20*  --   GLUCOSE 148*  --   --  139*  --   BUN 50*  --   --  44*  --   CREATININE 1.05*  --   --  0.91  --   CALCIUM 9.1  --   --  8.4*  --   AST 20  --   --  18  --   ALT 9  --   --  8  --   ALKPHOS 28*  --   --  34*  --   BILITOT 0.8  --   --  0.7  --   ALBUMIN 2.0*  --   --  1.7*  --   MG  --   --   --  1.9  --   CRP  --   --  24.4* 24.8*  --   DDIMER  --   --  3.35* 3.65*  --   PROCALCITON  --   --  2.40  --   --   LATICACIDVEN 1.6 1.0  --   --   --   INR 1.1  --   --   --   --   BNP  --   --   --   --  675.0*    ------------------------------------------------------------------------------------------------------------------ Recent Labs    11/27/21 1949  TRIG 55    No results found for: HGBA1C  No results for input(s): TSH, T4TOTAL, T3FREE, THYROIDAB in the last 72 hours.  Invalid input(s): FREET3 ------------------------------------------------------------------------------------------------------------------ ID Labs Recent Labs  Lab 11/27/21 1731 11/27/21 1919 11/27/21 1949 11/28/21 0201  WBC 17.2*  --   --  16.0*  PLT 375  --   --  328  CRP  --   --  24.4* 24.8*  DDIMER  --   --  3.35* 3.65*  PROCALCITON  --   --  2.40  --   LATICACIDVEN 1.6 1.0  --   --   CREATININE 1.05*  --   --  0.91   Cardiac Enzymes No results for input(s): CKMB, TROPONINI, MYOGLOBIN in the last 168 hours.  Invalid input(s): CK   Radiology Reports DG Chest Port 1 View  Result Date: 11/28/2021 CLINICAL DATA:  Shortness of breath EXAM: PORTABLE CHEST 1  VIEW COMPARISON:  11/27/2021 FINDINGS: 0643 hours. Lungs are hyperexpanded. The cardio pericardial silhouette  is enlarged. Interstitial markings are diffusely coarsened with chronic features. Persistent left base collapse/consolidation with small left pleural effusion, similar to prior. No focal airspace disease in the left lung. Bones are diffusely demineralized. Telemetry leads overlie the chest. IMPRESSION: 1. Stable exam. 2. Left base collapse/consolidation with small left pleural effusion. Electronically Signed   By: Misty Stanley M.D.   On: 11/28/2021 07:26   DG Chest Port 1 View  Result Date: 11/27/2021 CLINICAL DATA:  Sepsis, short of breath, cough EXAM: PORTABLE CHEST 1 VIEW COMPARISON:  02/01/2021 FINDINGS: Single frontal view of the chest demonstrates dense left basilar consolidation, with small left pleural effusion. Background emphysema. No pneumothorax. Cardiac silhouette is stable. No acute bony abnormalities. IMPRESSION: 1. Left lower lobe pneumonia, with small parapneumonic effusion. Electronically Signed   By: Randa Ngo M.D.   On: 11/27/2021 18:17

## 2021-11-28 NOTE — ED Notes (Signed)
MD at bedside assessing pt at this time.

## 2021-11-28 NOTE — Progress Notes (Addendum)
Pharmacy Antibiotic Note  Shelia Holt is a 88 y.o. female admitted on 11/27/2021 with pneumonia, failed azithromycin outpatient. Also noted to be COVID-19 positive and started on Remdesivir x 5 days. Pharmacy has been consulted to transition from ceftriaxone/azithromycin to vancomycin/zosyn dosing due to patient becoming septic per MD. SCr improved to 0.91 today.  Plan: Zosyn 3.375g IV (38min infusion) x1; then 3.375g IV q8h (4h infusion) Vancomycin 1g IV x1; then Vancomycin 750 mg IV Q 48 hrs. Goal AUC 400-550. Expected AUC: 496 SCr used: 0.91 Monitor clinical progress, c/s, renal function F/u de-escalation plan/LOT, vancomycin levels as indicated   Height: 5\' 3"  (160 cm) Weight: 43.1 kg (95 lb) IBW/kg (Calculated) : 52.4  Temp (24hrs), Avg:98.3 F (36.8 C), Min:98.3 F (36.8 C), Max:98.3 F (36.8 C)  Recent Labs  Lab 11/27/21 1731 11/27/21 1919 11/28/21 0201  WBC 17.2*  --  16.0*  CREATININE 1.05*  --  0.91  LATICACIDVEN 1.6 1.0  --     Estimated Creatinine Clearance: 24 mL/min (by C-G formula based on SCr of 0.91 mg/dL).    Allergies  Allergen Reactions   Celecoxib Palpitations    palpitations   Rofecoxib Palpitations    palpitations palpitations   Gabapentin Other (See Comments)    confusion   Levaquin [Levofloxacin In D5w] Other (See Comments)    aching   Levofloxacin Other (See Comments)    Aching, Muscle weakness Other reaction(s): Other (See Comments) Aching aching Muscle weakness   Nortriptyline Other (See Comments)    Change in Behavior Other reaction(s): Other (See Comments) Change in Behavior   Nortriptyline Hcl Other (See Comments)    Change in behavior Change in behavior    Antimicrobials this admission: 1/4 vancomycin >>  1/4 zosyn >>  1/3 ceftriaxone/azithromycin >> 1/4 1/3 remdesivir >> 1/7  Dose adjustments this admission:   Microbiology results: 1/3 BCx - ngtd 1/3 UC -   Arturo Morton, PharmD, BCPS Please check AMION  for all [place] contact numbers Clinical Pharmacist 11/28/2021 8:54 AM

## 2021-11-28 NOTE — ED Notes (Signed)
Per vascular tech, pt positive for DVT in RLE. Secure chat sent to MD notifying of same.

## 2021-11-28 NOTE — ED Notes (Signed)
PT/OT at bedside assessing pt at this time. Transfusion continues to infuse without difficulty. Daughter remains at bedside.

## 2021-11-28 NOTE — ED Notes (Signed)
Per Hildred Alamin in pharmacy, wait for blood transfusion to be completed before giving remdesivir.

## 2021-11-28 NOTE — Progress Notes (Signed)
°  Echocardiogram 2D Echocardiogram has been performed.  Shelia Holt 11/28/2021, 5:35 PM

## 2021-11-28 NOTE — Evaluation (Signed)
Occupational Therapy Evaluation Patient Details Name: Shelia Holt MRN: [id number] DOB: [birth year] Today's Date: 11/28/2021   History of Present Illness 88 y.o. female with medical history significant of glaucoma, macular degeneration, carotid stenosis, anemia, CKD 3, COPD, diabetes, hypertension, hyperlipidemia, prolactinoma, GERD, IBS admitted with acute hypoxic respiratory failure with sats below 85% on RA.  Positive for pneumonia with Covid-19 infection.   Clinical Impression   [name] required assist with ADLs from her daughter PTA and she is a household ambulator with a RW. She lives alone, however her daughter is her neighbor and checks on her several times throughout the day. Upon evaluation pt required mod A +2 for bed mobility, and had fair sitting EOB balance for ~8 minutes. Pt is limited by fatigue, generalized weakness, and poor activity tolerance. RR to 34 this session after sitting EOB, O2 saturation WNL on 3L [place]. Pt will benefit from OT acutely. Recommend d/c to home with max home health services to reduce caregiver burden.     Recommendations for follow up therapy are one component of a multi-disciplinary discharge planning process, led by the attending physician.  Recommendations may be updated based on patient status, additional functional criteria and insurance authorization.   Follow Up Recommendations  Home health OT (pt will benefit from [facility] services)    Assistance Recommended at Discharge Frequent or constant Supervision/Assistance  Patient can return home with the following A lot of help with walking and/or transfers;A lot of help with bathing/dressing/bathroom;Assistance with cooking/housework;Direct supervision/assist for medications management;Help with stairs or ramp for entrance;Assist for transportation    Functional Status Assessment  Patient has had a recent decline in their functional status and demonstrates the ability to make significant improvements in  function in a reasonable and predictable amount of time.  Equipment Recommendations  BSC/3in1    Recommendations for Other Services       Precautions / Restrictions Precautions Precautions: Fall Precaution Comments: watch O2, RR Restrictions Weight Bearing Restrictions: No      Mobility Bed Mobility Overal bed mobility: Needs Assistance Bed Mobility: Supine to Sit;Sit to Supine     Supine to sit: Mod assist;HOB elevated;+2 for safety/equipment Sit to supine: Mod assist;+2 for safety/equipment;HOB elevated   General bed mobility comments: assist for legs to EOB and lifting trunk, to supine assist for legs onto bed and positioning trunk    Transfers Overall transfer level: Needs assistance                 General transfer comment: NT due to weakness, recieving blood and new DVT found R LE (already on Lovenox)      Balance Overall balance assessment: Needs assistance Sitting-balance support: Feet unsupported Sitting balance-Leahy Scale: Poor Sitting balance - Comments: initially needing support for balance, progressed as up at EOB to sitting without external support and drinking Ensure without UE support                                   ADL either performed or assessed with clinical judgement   ADL Overall ADL's : Needs assistance/impaired Eating/Feeding: Minimal assistance;Sitting   Grooming: Moderate assistance;Sitting   Upper Body Bathing: Moderate assistance;Maximal assistance;Sitting   Lower Body Bathing: Maximal assistance;Bed level   Upper Body Dressing : Moderate assistance;Sitting   Lower Body Dressing: Maximal assistance;Bed level   Toilet Transfer: Maximal assistance;+2 for physical assistance;+2 for safety/equipment   Toileting- Clothing Manipulation and Hygiene: Maximal assistance;Bed  level       Functional mobility during ADLs: Moderate assistance (bed level only this session) General ADL Comments: pt is significantly  limited by generalized weakness and poor activity tolerance     Vision Baseline Vision/History: 0 No visual deficits Ability to See in Adequate Light: 0 Adequate Patient Visual Report: No change from baseline Vision Assessment?: No apparent visual deficits     Perception     Praxis      Pertinent Vitals/Pain Pain Assessment: Faces Faces Pain Scale: Hurts little more Pain Location: generalized with mobility Pain Descriptors / Indicators: Grimacing;Guarding;Aching Pain Intervention(s): Monitored during session;Repositioned     Hand Dominance Right   Extremity/Trunk Assessment Upper Extremity Assessment Upper Extremity Assessment: Defer to OT evaluation RUE Deficits / Details: generally weak throughout, unable to lift arms over head. elbow, wrist and hand ROM is WFL. bony process at R wrist was red and tender. used functionally to drink from cup with a straw RUE Coordination: decreased fine motor;decreased gross motor LUE Deficits / Details: generally weak throughout, unable to lift above head. elbow, wrist and hand ROM is WFL. LUE Coordination: decreased fine motor;decreased gross motor   Lower Extremity Assessment Lower Extremity Assessment: RLE deficits/detail;LLE deficits/detail RLE Deficits / Details: AAROM WFL strength hip flexion 3-/5, knee extension 4-/5; ankle DF 4/5 RLE Sensation: history of peripheral neuropathy;decreased light touch LLE Deficits / Details: AAROM WFL strength hip flexion 3-/5, knee extension 4-/5; ankle DF 4/5 LLE Sensation: history of peripheral neuropathy;decreased light touch   Cervical / Trunk Assessment Cervical / Trunk Assessment: Kyphotic   Communication Communication Communication: No difficulties   Cognition Arousal/Alertness: Awake/alert Behavior During Therapy: Flat affect Overall Cognitive Status: History of cognitive impairments - at baseline Area of Impairment: Orientation                 Orientation Level: Disoriented  to;Time             General Comments: daughter reports normally disoriented to time     General Comments  RR as high as 34, on 3L O2 throughout SpO2 maintianing in 90's. BP stable    Exercises     Shoulder Instructions      Home Living Family/patient expects to be discharged to:: Private residence Living Arrangements: Alone Available Help at Discharge: Family;Available PRN/intermittently Type of Home: House Home Access: Stairs to enter     Home Layout: One level     Bathroom Shower/Tub: Tub/shower unit;Sponge bathes at baseline   [employer]: Standard     Home Equipment: Conservation officer, nature (2 wheels)          Prior Functioning/Environment Prior Level of Function : Needs assist       Physical Assist : ADLs (physical) Mobility (physical): Bed mobility;Transfers;Gait;Stairs ADLs (physical): Dressing;Bathing;IADLs Mobility Comments: using RW only about a year or so since last fall ADLs Comments: daughter assists with bathing/dressing and provides meals        OT Problem List: Decreased strength;Decreased range of motion;Decreased activity tolerance;Impaired balance (sitting and/or standing);Decreased safety awareness;Cardiopulmonary status limiting activity;Pain      OT Treatment/Interventions:      OT Goals(Current goals can be found in the care plan section) Acute Rehab OT Goals Patient Stated Goal: home OT Goal Formulation: With patient Time For Goal Achievement: 12/12/21 Potential to Achieve Goals: Good ADL Goals Pt Will Perform Grooming: with supervision;standing Pt Will Perform Lower Body Dressing: with min guard assist;sit to/from stand Pt Will Transfer to Toilet: with min guard assist;ambulating Additional ADL  Goal #1: pt will tolerate at least 5 minutes of OOB functional activity  OT Frequency: Min 2X/week    Co-evaluation PT/OT/SLP Co-Evaluation/Treatment: Yes Reason for Co-Treatment: For patient/therapist safety;To address functional/ADL  transfers PT goals addressed during session: Balance;Mobility/safety with mobility OT goals addressed during session: ADL's and self-care      AM-PAC OT "6 Clicks" Daily Activity     Outcome Measure Help from another person eating meals?: A Little Help from another person taking care of personal grooming?: A Lot Help from another person toileting, which includes using toliet, bedpan, or urinal?: A Lot Help from another person bathing (including washing, rinsing, drying)?: A Lot Help from another person to put on and taking off regular upper body clothing?: A Lot Help from another person to put on and taking off regular lower body clothing?: A Lot 6 Click Score: 13   End of Session Equipment Utilized During Treatment: Oxygen Nurse Communication: Mobility status  Activity Tolerance: Patient limited by fatigue Patient left: in bed;with call bell/phone within reach;with family/visitor present  OT Visit Diagnosis: Unsteadiness on feet (R26.81);Other abnormalities of gait and mobility (R26.89);Muscle weakness (generalized) (M62.81);History of falling (Z91.81);Pain                Time: 8022-3361 OT Time Calculation (min): 28 min Charges:  OT General Charges $OT Visit: 1 Visit OT Evaluation $OT Eval Moderate Complexity: 1 Mod   Erabella Kuipers A Oswald Pott 11/28/2021, 3:06 PM

## 2021-11-29 ENCOUNTER — Other Ambulatory Visit: Payer: Self-pay

## 2021-11-29 DIAGNOSIS — J9601 Acute respiratory failure with hypoxia: Secondary | ICD-10-CM | POA: Diagnosis not present

## 2021-11-29 LAB — COMPREHENSIVE METABOLIC PANEL
ALT: 12 U/L (ref 0–44)
AST: 28 U/L (ref 15–41)
Albumin: 1.5 g/dL — ABNORMAL LOW (ref 3.5–5.0)
Alkaline Phosphatase: 27 U/L — ABNORMAL LOW (ref 38–126)
Anion gap: 12 (ref 5–15)
BUN: 46 mg/dL — ABNORMAL HIGH (ref 8–23)
CO2: 18 mmol/L — ABNORMAL LOW (ref 22–32)
Calcium: 8.2 mg/dL — ABNORMAL LOW (ref 8.9–10.3)
Chloride: 100 mmol/L (ref 98–111)
Creatinine, Ser: 1.01 mg/dL — ABNORMAL HIGH (ref 0.44–1.00)
GFR, Estimated: 51 mL/min — ABNORMAL LOW (ref >60)
Glucose, Bld: 330 mg/dL — ABNORMAL HIGH (ref 70–99)
Potassium: 3.6 mmol/L (ref 3.5–5.1)
Sodium: 130 mmol/L — ABNORMAL LOW (ref 135–145)
Total Bilirubin: 0.4 mg/dL (ref 0.3–1.2)
Total Protein: 4.8 g/dL — ABNORMAL LOW (ref 6.5–8.1)

## 2021-11-29 LAB — TYPE AND SCREEN
ABO/RH(D): A POS
Antibody Screen: NEGATIVE
Unit division: 0

## 2021-11-29 LAB — GLUCOSE, CAPILLARY
Glucose-Capillary: 199 mg/dL — ABNORMAL HIGH (ref 70–99)
Glucose-Capillary: 249 mg/dL — ABNORMAL HIGH (ref 70–99)
Glucose-Capillary: 261 mg/dL — ABNORMAL HIGH (ref 70–99)
Glucose-Capillary: 174 mg/dL — ABNORMAL HIGH (ref 70–99)

## 2021-11-29 LAB — CBC WITH DIFFERENTIAL/PLATELET
Abs Immature Granulocytes: 0.6 10*3/uL — ABNORMAL HIGH (ref 0.00–0.07)
Basophils Absolute: 0 10*3/uL (ref 0.0–0.1)
Basophils Relative: 0 %
Eosinophils Absolute: 0 10*3/uL (ref 0.0–0.5)
Eosinophils Relative: 0 %
HCT: 26.5 % — ABNORMAL LOW (ref 36.0–46.0)
Hemoglobin: 8.6 g/dL — ABNORMAL LOW (ref 12.0–15.0)
Immature Granulocytes: 4 %
Lymphocytes Relative: 4 %
Lymphs Abs: 0.6 10*3/uL — ABNORMAL LOW (ref 0.7–4.0)
MCH: 30.5 pg (ref 26.0–34.0)
MCHC: 32.5 g/dL (ref 30.0–36.0)
MCV: 94 fL (ref 80.0–100.0)
Monocytes Absolute: 1.1 10*3/uL — ABNORMAL HIGH (ref 0.1–1.0)
Monocytes Relative: 7 %
Neutro Abs: 13 10*3/uL — ABNORMAL HIGH (ref 1.7–7.7)
Neutrophils Relative %: 85 %
Platelets: 344 10*3/uL (ref 150–400)
RBC: 2.82 MIL/uL — ABNORMAL LOW (ref 3.87–5.11)
RDW: 18.9 % — ABNORMAL HIGH (ref 11.5–15.5)
WBC: 15.3 10*3/uL — ABNORMAL HIGH (ref 4.0–10.5)
nRBC: 0.1 % (ref 0.0–0.2)

## 2021-11-29 LAB — HEPARIN LEVEL (UNFRACTIONATED)
Heparin Unfractionated: <0.1 IU/mL — ABNORMAL LOW (ref 0.30–0.70)
Heparin Unfractionated: 0.11 IU/mL — ABNORMAL LOW (ref 0.30–0.70)
Heparin Unfractionated: 0.32 IU/mL (ref 0.30–0.70)

## 2021-11-29 LAB — BPAM RBC
Blood Product Expiration Date: 202301242359
ISSUE DATE / TIME: 202301041158
Unit Type and Rh: 6200

## 2021-11-29 LAB — PROCALCITONIN: Procalcitonin: 6.3 ng/mL

## 2021-11-29 LAB — BRAIN NATRIURETIC PEPTIDE: B Natriuretic Peptide: 942.3 pg/mL — ABNORMAL HIGH (ref 0.0–100.0)

## 2021-11-29 LAB — URINE CULTURE

## 2021-11-29 LAB — C-REACTIVE PROTEIN: CRP: 20.6 mg/dL — ABNORMAL HIGH (ref <1.0)

## 2021-11-29 LAB — MAGNESIUM: Magnesium: 1.9 mg/dL (ref 1.7–2.4)

## 2021-11-29 LAB — D-DIMER, QUANTITATIVE: D-Dimer, Quant: 3.15 ug/mL-FEU — ABNORMAL HIGH (ref 0.00–0.50)

## 2021-11-29 MED ORDER — FUROSEMIDE 10 MG/ML IJ SOLN
40.0000 mg | Freq: Once | INTRAMUSCULAR | Status: AC
  Administered 2021-11-29: 40 mg via INTRAVENOUS
  Filled 2021-11-29: qty 4

## 2021-11-29 MED ORDER — SODIUM CHLORIDE 0.9 % IV SOLN
INTRAVENOUS | Status: DC | PRN
  Administered 2021-12-02: 10 mL via INTRAVENOUS

## 2021-11-29 MED ORDER — MAGNESIUM SULFATE 2 GM/50ML IV SOLN
2.0000 g | Freq: Two times a day (BID) | INTRAVENOUS | Status: AC
  Administered 2021-11-29 (×2): 2 g via INTRAVENOUS
  Filled 2021-11-29 (×2): qty 50

## 2021-11-29 NOTE — Plan of Care (Signed)
°  Problem: Respiratory: Goal: Ability to maintain adequate ventilation will improve Outcome: Progressing   Problem: Elimination: Goal: Will not experience complications related to urinary retention Outcome: Progressing   Problem: Pain Managment: Goal: General experience of comfort will improve Outcome: Progressing

## 2021-11-29 NOTE — Consult Note (Signed)
° °  [facility] CM Inpatient Consult   11/29/2021  Shelia Holt 1922/10/13 [id number]  [place] [facility] [ACO] Patient: Medicare  Primary Care Provider:  Deland Pretty, MD, [facility] is listed to provide [facility] follow up   Patient screened for hospitalization with noted high risk score for unplanned readmission risk and to assess for potential [place] [facility] service needs for post hospital transition.  Review of patient's medical record reveals patient is currently being recommended for home with home health [facility] aide as well].  Patient currently on airborne precautions - COVID-19 positive. Reviewed MD progress notes, PT/OT notes, inpatient TOC notes.  Patient lived alone PTA her daughter is her neighbor per notes.  Plan:  Continue to follow progress and disposition with inpatient [facility] team to assess for post hospital care management/support needs.    For questions contact:   Natividad Brood, RN BSN [facility] Liaison  [phone] business mobile phone Toll free office [phone]  Fax number: [phone] [email or web address]

## 2021-11-29 NOTE — TOC Progression Note (Signed)
Transition of Care [facility]) - Progression Note    Patient Details  Name: Shelia Holt MRN: [id number] Date of Birth: 02-Nov-1922  Transition of Care [facility]) CM/SW Contact  Zenon Mayo, RN Phone Number: 11/29/2021, 9:25 AM  Clinical Narrative:     Transition of Care [facility]) Screening Note   Patient Details  Name: Shelia Holt Date of Birth: 1922-06-24   Transition of Care [facility]) CM/SW Contact:    Zenon Mayo, RN Phone Number: 11/29/2021, 9:25 AM    Transition of Care Department [facility]) has reviewed patient and no TOC needs have been identified at this time. We will continue to monitor patient advancement through interdisciplinary progression rounds. If new patient transition needs arise, please place a TOC consult.          Expected Discharge Plan and Services                                                 Social Determinants of Health (SDOH) Interventions    Readmission Risk Interventions No flowsheet data found.

## 2021-11-29 NOTE — Progress Notes (Signed)
ANTICOAGULATION CONSULT NOTE - Follow Up Consult  Pharmacy Consult for Heparin Indication: DVT and possible PE  Allergies  Allergen Reactions   Celecoxib Palpitations    palpitations   Rofecoxib Palpitations    palpitations palpitations   Gabapentin Other (See Comments)    confusion   Levaquin [Levofloxacin In D5w] Other (See Comments)    aching   Levofloxacin Other (See Comments)    Aching, Muscle weakness Other reaction(s): Other (See Comments) Aching aching Muscle weakness   Nortriptyline Other (See Comments)    Change in Behavior Other reaction(s): Other (See Comments) Change in Behavior   Nortriptyline Hcl Other (See Comments)    Change in behavior Change in behavior    Patient Measurements: Height: 5\' 4"  (162.6 cm) Weight: 46.4 kg (102 lb 4.7 oz) IBW/kg (Calculated) : 54.7 Heparin Dosing Weight: 46.4 kg  Vital Signs: Temp: 97.3 F (36.3 C) (01/05 0430) Temp Source: Oral (01/05 0430) BP: 100/52 (01/05 0430) Pulse Rate: 79 (01/05 0430)  Labs: Recent Labs    11/27/21 1731 11/28/21 0201 11/28/21 1649 11/29/21 0032 11/29/21 1058  HGB 8.4* 7.5* 9.0* 8.6*  --   HCT 27.3* 24.5* 29.6* 26.5*  --   PLT 375 328 351 344  --   APTT 30  --   --   --   --   LABPROT 14.5  --   --   --   --   INR 1.1  --   --   --   --   HEPARINUNFRC  --   --   --  <0.10* 0.11*  CREATININE 1.05* 0.91  --  1.01*  --     Estimated Creatinine Clearance: 23.3 mL/min (A) (by C-G formula based on SCr of 1.01 mg/dL (H)).  Assessment: 77 YOF presenting 11/27/20 with SOB, COVID +, elevated D-dimer and CT angio chest with possible embolic disease.  Lovenox 30mg  DVT prophylactis dose given 1/3 @2230 . Changed to IV heparin 1/4 pm. Duplex 1/4 showed RLE DVT.    Initial heparin level undetectable (< 0.10) on 650 units/hr and infusion rate increased. Heparin level remains subtherapeutic (0.11) on 800 units/hr. FOB+ stools, recent and continued hemorrhoidal bleeding.  Transfused 1/4.  To monitor  closely for worsening of bleeding and tolerance for continued anticoagulation.  Will target low therapeutic heparin levels.  Goal of Therapy:  Heparin level 0.3-0.5 units/ml  Monitor platelets by anticoagulation protocol: Yes   Plan:  Increase heparin drip to 950 units/hr Heparin level ~8 hrs after rate change. Daily heparin level and CBC while on heparin. Monitor hemorrhoidal bleeding.  Arty Baumgartner, RPh 11/29/2021,12:09 PM

## 2021-11-29 NOTE — Progress Notes (Signed)
ANTICOAGULATION CONSULT NOTE - Follow Up Consult  Pharmacy Consult for heparin Indication:  +DVT with possible PE  Labs: Recent Labs    11/27/21 1731 11/28/21 0201 11/28/21 1649 11/29/21 0032  HGB 8.4* 7.5* 9.0* 8.6*  HCT 27.3* 24.5* 29.6* 26.5*  PLT 375 328 351 344  APTT 30  --   --   --   LABPROT 14.5  --   --   --   INR 1.1  --   --   --   HEPARINUNFRC  --   --   --  <0.10*  CREATININE 1.05* 0.91  --  1.01*    Assessment: 88yo female subtherapeutic on heparin with initial dosing for possible PE, Doppler positive for RLE DVT; no infusion issues per RN but she notes that pt c/o dark red stools though pt known to have hemorrhoids.  Goal of Therapy:  Heparin level 0.3-0.7 units/ml   Plan:  Will defer bolus and increase heparin infusion by 3-4 units/kg/hr to 800 units/hr and check level in 8 hours.    Wynona Neat, PharmD, BCPS  11/29/2021,2:41 AM

## 2021-11-29 NOTE — Progress Notes (Signed)
ANTICOAGULATION CONSULT NOTE - Follow Up Consult  Pharmacy Consult for Heparin Indication: DVT and possible PE  Allergies  Allergen Reactions   Celecoxib Palpitations    palpitations   Rofecoxib Palpitations    palpitations palpitations   Gabapentin Other (See Comments)    confusion   Levaquin [Levofloxacin In D5w] Other (See Comments)    aching   Levofloxacin Other (See Comments)    Aching, Muscle weakness Other reaction(s): Other (See Comments) Aching aching Muscle weakness   Nortriptyline Other (See Comments)    Change in Behavior Other reaction(s): Other (See Comments) Change in Behavior   Nortriptyline Hcl Other (See Comments)    Change in behavior Change in behavior    Patient Measurements: Height: 5\' 4"  (162.6 cm) Weight: 46.4 kg (102 lb 4.7 oz) IBW/kg (Calculated) : 54.7 Heparin Dosing Weight: 46.4 kg  Vital Signs: Temp: 97.7 F (36.5 C) (01/05 1611) Temp Source: Oral (01/05 2010) BP: 116/60 (01/05 2010) Pulse Rate: 71 (01/05 2010)  Labs: Recent Labs    11/27/21 1731 11/28/21 0201 11/28/21 1649 11/29/21 0032 11/29/21 1058 11/29/21 2027  HGB 8.4* 7.5* 9.0* 8.6*  --   --   HCT 27.3* 24.5* 29.6* 26.5*  --   --   PLT 375 328 351 344  --   --   APTT 30  --   --   --   --   --   LABPROT 14.5  --   --   --   --   --   INR 1.1  --   --   --   --   --   HEPARINUNFRC  --   --   --  <0.10* 0.11* 0.32  CREATININE 1.05* 0.91  --  1.01*  --   --      Estimated Creatinine Clearance: 23.3 mL/min (A) (by C-G formula based on SCr of 1.01 mg/dL (H)).  Assessment: 42 YOF presenting 11/27/20 with SOB, COVID +, elevated D-dimer and CT angio chest with possible embolic disease.  Lovenox 30mg  DVT prophylactis dose given 1/3 @2230 . Changed to IV heparin 1/4 pm. Duplex 1/4 showed RLE DVT.    Initial heparin level undetectable (< 0.10) on 650 units/hr and infusion rate increased. Heparin drip 950 uts/hr with heparin level 0.3 - at goal.  FOB+ stools, recent and  continued hemorrhoidal bleeding.  Transfused 1/4.  To monitor closely for worsening of bleeding and tolerance for continued anticoagulation.  Will target low therapeutic heparin levels.  Goal of Therapy:  Heparin level 0.3-0.5 units/ml  Monitor platelets by anticoagulation protocol: Yes   Plan:  Continue heparin drip to 950 units/hr Daily heparin level and CBC  Monitor hemorrhoidal bleeding.  Bonnita Nasuti Pharm.D. CPP, BCPS Clinical Pharmacist [phone] 11/29/2021 10:15 PM

## 2021-11-29 NOTE — Progress Notes (Signed)
PROGRESS NOTE                                                                                                                                                                                                             Patient Demographics:    Shelia Holt, is a 88 y.o. female, DOB - 1922-07-18, INO:[id number]  Outpatient Primary MD for the patient is Deland Pretty, MD    LOS - 1  Admit date - 11/27/2021    Chief Complaint  Patient presents with   Shortness of Breath    Productive cough       Brief Narrative (HPI from H&P)   88 y.o. female with medical history significant of glaucoma, macular degeneration, carotid stenosis, anemia, CKD 3, COPD, diabetes, hypertension, hyperlipidemia, prolactinoma, GERD, IBS who presents with ongoing shortness of breath and cough.  In the ER she was diagnosed with acute hypoxic respiratory failure with sats below 85% on room air.  She was diagnosed with pneumonia due to bacterial infection along with COVID-19 infection and admitted to the hospital.   Subjective:   Patient in bed, appears comfortable, denies any headache, no fever, no chest pain or pressure, continues to have mild shortness of breath , no abdominal pain. No new focal weakness.   Assessment  & Plan :     Acute Hypoxic Resp. Failure due to bacterial pneumonia along with COVID-19 infection - she has a productive cough along with leukocytosis and elevated procalcitonin, she has already failed outpatient azithromycin 5-day course.  Currently she seems to have severe bacterial pneumonia along with COVID-19 pneumonitis as well.  Continue combination of steroids, Vancomycin and Zosyn.  Overall quite tenuous.  Poor candidate for Actemra due to advanced age, concurrent bacterial infection.  Continue supportive care with I-S, flutter valve, oxygen and nebulizer treatments. SLP eval, monitor cultures closely.  Encouraged the patient to sit  up in chair in the daytime use I-S and flutter valve for pulmonary toiletry.  Will advance activity and titrate down oxygen as possible.  2.  Acute on chronic anemia.  Recently had hemorrhoidal bleed, she has required outpatient transfusions according to the daughter recently, she is s/p 1 unit packed RBC transfusion on 11/28/2021 with stable posttransfusion H&H, on twice daily PPI, continue to monitor closely on heparin drip if hemorrhoidal bleeding gets worse she may require  discontinuation of anticoagulation and an IVC filter.  3.  Elevated D-dimer.  Has acute right common femoral vein DVT along with inconclusive CTA chest however with her hypoxia and elevated pulmonary artery pressure likely has PE as well, continue heparin drip with cautious monitoring of H&H.  4.  Glaucoma and macular degeneration.  Continue home regimen of eyedrops and methazolamide.  5.  CKD 3A.  Baseline creatinine around 1.3.  Monitor.  6.  Deconditioning and weakness.  Due to advanced age.  At risk for delirium.  Family want.  Minimize narcotics and benzodiazepines.  Use as needed Haldol if she gets confused.  7.  History of CAD.  Chest pain-free.  Echocardiogram noted with preserved EF of 60%, no regional wall motion abnormality but moderate pulmonary artery hypertension along with severe AR, mild AS and moderate MR.  8.  Severe aortic regurgitation.  Could be contributing to some elevation in right-sided pressures, outpatient cardiology follow-up, poor candidate for invasive procedures.  9.  Hypomagnesemia.  Replaced.      Condition - Extremely Guarded  Family Communication  : Daughter Terri bedside on 11/28/2021, over the phone [phone] on 11/29/2021 at 9:33 AM message left.  Code Status : DNR  Consults  : None  PUD Prophylaxis : Protonix   Procedures  :     TTE - 1. Left ventricular ejection fraction, by estimation, is 60 to 65%. The left ventricle has normal function. The left ventricle has no regional  wall motion abnormalities.  2. Right ventricular systolic function is normal. The right ventricular size is normal. There is moderately elevated pulmonary artery systolic pressure.  3. Left atrial size was severely dilated.  4. Right atrial size was severely dilated.  5. The mitral valve is abnormal. Moderate mitral valve regurgitation.  6. There is moderate calcification of the aortic valve. There is moderate thickening of the aortic valve. Aortic valve regurgitation is severe. Mild aortic valve stenosis with AVA 1.3cm2, mean gradient 2mmHg, Vmax 2.77m/s, DI 0.4.  7. The inferior vena cava is normal in size with greater than 50% respiratory variability, suggesting right atrial pressure of 3 mmHg. Comparison(s): Compared to prior echo report in 2007, there is now severe AR, mild AS, moderate MR  CTA Chest - There are bilateral pleural effusions layering dependently, larger on the left than the right. The right lung shows mild dependent atelectasis. Some background emphysematous changes in the upper lobes. The left lung shows more considerable atelectasis in the lower lobe, possibly with pre-existing pneumonia. There is also infiltrate and volume loss in the lingula. In this setting, there is diminished opacification of the left lower lobe pulmonary arterial branches. We are very early in the contrast bolus and I favor that the appearance of the left lower lobe pulmonary arterial branches is due to early opacification with mixing of opacified and unopacified blood. Unfortunately, I cannot completely rule out possibility of embolic disease within the left lower lobe. Arguing against this is the absence of any embolus elsewhere within the pulmonary arterial tree. Aortic Atherosclerosis (ICD10-I70.0) and Emphysema (ICD10-J43.9). Coronary artery calcification also present.  Leg [place] - right common femoral vein acute DVT.      Disposition Plan  :    Status is: Inpt   DVT Prophylaxis  :  Hep gtt    Lab  Results  Component Value Date   PLT 344 11/29/2021    Diet :  Diet Order             Diet  Heart Room service appropriate? Yes; Fluid consistency: Thin  Diet effective now                    Inpatient Medications  Scheduled Meds:  vitamin C  500 mg Oral Daily   brimonidine  1 drop Both Eyes BID   cycloSPORINE  1 drop Both Eyes BID   dexamethasone (DECADRON) injection  10 mg Intravenous Q24H   dorzolamide-timolol  1 drop Both Eyes BID   feeding supplement  237 mL Oral BID BM   furosemide  40 mg Intravenous Once   insulin aspart  0-9 Units Subcutaneous TID WC   Ipratropium-Albuterol  1 puff Inhalation Q6H   melatonin  3 mg Oral QHS   methazolamide  25 mg Oral BID   Netarsudil-Latanoprost  1 drop Both Eyes QHS   pantoprazole  40 mg Oral BID AC   pilocarpine  1 drop Both Eyes TID   sodium chloride flush  3 mL Intravenous Q12H   umeclidinium bromide  1 puff Inhalation Daily   zinc sulfate  220 mg Oral Daily   Continuous Infusions:  heparin 800 Units/hr (11/29/21 0250)   magnesium sulfate bolus IVPB     piperacillin-tazobactam (ZOSYN)  IV 3.375 g (11/29/21 0759)   remdesivir 100 mg in NS 100 mL Stopped (11/28/21 1607)   [START ON 11/30/2021] vancomycin     PRN Meds:.acetaminophen **OR** acetaminophen, albuterol, chlorpheniramine-HYDROcodone, diclofenac Sodium, guaiFENesin-dextromethorphan, polyethylene glycol, polyvinyl alcohol, traMADol  Time Spent in minutes  30   Lala Lund M.D on 11/29/2021 at 9:35 AM  To page go to [email or web address]   [facility] -  Office  [phone]  See all Orders from today for further details    Objective:   Vitals:   11/28/21 2300 11/28/21 2345 11/29/21 0023 11/29/21 0430  BP: (!) 110/52 (!) 122/49  (!) 100/52  Pulse: 80 79  79  Resp: (!) 23 20  20   Temp:  (!) 96.4 F (35.8 C)  (!) 97.3 F (36.3 C)  TempSrc:  Oral  Oral  SpO2: 100% 100%  99%  Weight:   46.4 kg   Height:   5\' 4"  (1.626 m)     Wt Readings from Last 3  Encounters:  11/29/21 46.4 kg  09/14/21 45.4 kg  08/23/21 44.5 kg     Intake/Output Summary (Last 24 hours) at 11/29/2021 0935 Last data filed at 11/29/2021 0433 Gross per 24 hour  Intake 1495.83 ml  Output 0 ml  Net 1495.83 ml     Physical Exam  Awake Alert, No new F.N deficits, Normal affect [place].AT,PERRAL Supple Neck, No JVD,   Symmetrical Chest wall movement, Good air movement bilaterally, +ve rales RRR,No Gallops,Rubs or new Murmurs,  +ve B.Sounds, Abd Soft, No tenderness,   No Cyanosis, Clubbing or edema      Data Review:    CBC Recent Labs  Lab 11/27/21 1731 11/28/21 0201 11/28/21 1649 11/29/21 0032  WBC 17.2* 16.0* 14.2* 15.3*  HGB 8.4* 7.5* 9.0* 8.6*  HCT 27.3* 24.5* 29.6* 26.5*  PLT 375 328 351 344  MCV 103.0* 102.9* 97.7 94.0  MCH 31.7 31.5 29.7 30.5  MCHC 30.8 30.6 30.4 32.5  RDW 16.0* 16.3* 18.5* 18.9*  LYMPHSABS 0.7 0.3*  --  0.6*  MONOABS 1.4* 0.5  --  1.1*  EOSABS 0.2 0.0  --  0.0  BASOSABS 0.0 0.0  --  0.0    Electrolytes Recent Labs  Lab 11/27/21 1731 11/27/21 1919 11/27/21 1949 11/28/21 0201  11/28/21 0911 11/29/21 0032  NA 135  --   --  135  --  130*  K 4.0  --   --  4.3  --  3.6  CL 102  --   --  103  --  100  CO2 20*  --   --  20*  --  18*  GLUCOSE 148*  --   --  139*  --  330*  BUN 50*  --   --  44*  --  46*  CREATININE 1.05*  --   --  0.91  --  1.01*  CALCIUM 9.1  --   --  8.4*  --  8.2*  AST 20  --   --  18  --  28  ALT 9  --   --  8  --  12  ALKPHOS 28*  --   --  34*  --  27*  BILITOT 0.8  --   --  0.7  --  0.4  ALBUMIN 2.0*  --   --  1.7*  --  1.5*  MG  --   --   --  1.9  --  1.9  CRP  --   --  24.4* 24.8*  --  20.6*  DDIMER  --   --  3.35* 3.65*  --  3.15*  PROCALCITON  --   --  2.40  --   --  6.30  LATICACIDVEN 1.6 1.0  --   --   --   --   INR 1.1  --   --   --   --   --   BNP  --   --   --   --  675.0* 942.3*     ------------------------------------------------------------------------------------------------------------------ Recent Labs    11/27/21 1949  TRIG 55    No results found for: HGBA1C  No results for input(s): TSH, T4TOTAL, T3FREE, THYROIDAB in the last 72 hours.  Invalid input(s): FREET3 ------------------------------------------------------------------------------------------------------------------ ID Labs Recent Labs  Lab 11/27/21 1731 11/27/21 1919 11/27/21 1949 11/28/21 0201 11/28/21 1649 11/29/21 0032  WBC 17.2*  --   --  16.0* 14.2* 15.3*  PLT 375  --   --  328 351 344  CRP  --   --  24.4* 24.8*  --  20.6*  DDIMER  --   --  3.35* 3.65*  --  3.15*  PROCALCITON  --   --  2.40  --   --  6.30  LATICACIDVEN 1.6 1.0  --   --   --   --   CREATININE 1.05*  --   --  0.91  --  1.01*   Cardiac Enzymes No results for input(s): CKMB, TROPONINI, MYOGLOBIN in the last 168 hours.  Invalid input(s): CK   Radiology Reports CT Angio Chest Pulmonary Embolism (PE) W or WO Contrast  Result Date: 11/28/2021 CLINICAL DATA:  Pulmonary embolism suspected, high probability. Shortness of breath and cough. Pneumonia. EXAM: CT ANGIOGRAPHY CHEST WITH CONTRAST TECHNIQUE: Multidetector CT imaging of the chest was performed using the standard protocol during bolus administration of intravenous contrast. Multiplanar CT image reconstructions and MIPs were obtained to evaluate the vascular anatomy. CONTRAST:  71mL OMNIPAQUE IOHEXOL 350 MG/ML SOLN COMPARISON:  Chest radiography same day. FINDINGS: Cardiovascular: Mild cardiomegaly. No pericardial fluid. Coronary artery calcification is present. Aortic atherosclerotic calcification is present. Pulmonary arterial opacification is good, though somewhat early. No pulmonary emboli are seen on the right. There is diminished opacification within the left lower lobe  pulmonary arterial tree. One could argue that this is slow flow because of infiltrate and  collapse in the left lower lobe, but there is suggestion of a central filling defect raising the possibility of embolic disease to the left lower lobe. Unfortunately, I cannot state that with absolute certainty. Mediastinum/Nodes: No mass or lymphadenopathy. Lungs/Pleura: Bilateral layering effusions, larger on the left than the right. Mild emphysematous changes in the upper lobes. Mild dependent atelectasis in the right lung. There is a Bochdalek's hernia on the right. On the left, there is more extensive volume loss and consolidation in the left lower lobe in there may be actual infectious pneumonia. Some volume loss and infiltrate seen in the lingula as well. Upper Abdomen: No acute finding.  Left renal cyst. Musculoskeletal: Ordinary mild spinal degenerative changes. Review of the MIP images confirms the above findings. IMPRESSION: There are bilateral pleural effusions layering dependently, larger on the left than the right. The right lung shows mild dependent atelectasis. Some background emphysematous changes in the upper lobes. The left lung shows more considerable atelectasis in the lower lobe, possibly with pre-existing pneumonia. There is also infiltrate and volume loss in the lingula. In this setting, there is diminished opacification of the left lower lobe pulmonary arterial branches. We are very early in the contrast bolus and I favor that the appearance of the left lower lobe pulmonary arterial branches is due to early opacification with mixing of opacified and unopacified blood. Unfortunately, I cannot completely rule out possibility of embolic disease within the left lower lobe. Arguing against this is the absence of any embolus elsewhere within the pulmonary arterial tree. Aortic Atherosclerosis (ICD10-I70.0) and Emphysema (ICD10-J43.9). Coronary artery calcification also present. Electronically Signed   By: Nelson Chimes M.D.   On: 11/28/2021 12:03   DG Chest Port 1 View  Result Date:  11/28/2021 CLINICAL DATA:  Shortness of breath EXAM: PORTABLE CHEST 1 VIEW COMPARISON:  11/27/2021 FINDINGS: 0643 hours. Lungs are hyperexpanded. The cardio pericardial silhouette is enlarged. Interstitial markings are diffusely coarsened with chronic features. Persistent left base collapse/consolidation with small left pleural effusion, similar to prior. No focal airspace disease in the left lung. Bones are diffusely demineralized. Telemetry leads overlie the chest. IMPRESSION: 1. Stable exam. 2. Left base collapse/consolidation with small left pleural effusion. Electronically Signed   By: Misty Stanley M.D.   On: 11/28/2021 07:26   DG Chest Port 1 View  Result Date: 11/27/2021 CLINICAL DATA:  Sepsis, short of breath, cough EXAM: PORTABLE CHEST 1 VIEW COMPARISON:  02/01/2021 FINDINGS: Single frontal view of the chest demonstrates dense left basilar consolidation, with small left pleural effusion. Background emphysema. No pneumothorax. Cardiac silhouette is stable. No acute bony abnormalities. IMPRESSION: 1. Left lower lobe pneumonia, with small parapneumonic effusion. Electronically Signed   By: Randa Ngo M.D.   On: 11/27/2021 18:17   VAS [place] LOWER EXTREMITY VENOUS (DVT)  Result Date: 11/28/2021  Lower Venous DVT Study Patient Name:  GAZELLE TOWE  Date of Exam:   11/28/2021 Medical Rec #: [id number]        Accession #:    [id number] Date of Birth: 08-Jun-1922        Patient Gender: F Patient Age:   29 years Exam Location:  [facility] Procedure:      VAS [place] LOWER EXTREMITY VENOUS (DVT) Referring Phys: Deno Etienne [facility] --------------------------------------------------------------------------------  Indications: Elevated Ddimer.  Risk Factors: COVID 19 positive. Limitations: Body habitus and poor ultrasound/tissue interface. Comparison Study: No prior studies. Performing  Technologist: Oliver Hum RVT  Examination Guidelines: A complete evaluation includes B-mode imaging, spectral Doppler, color  Doppler, and power Doppler as needed of all accessible portions of each vessel. Bilateral testing is considered an integral part of a complete examination. Limited examinations for reoccurring indications may be performed as noted. The reflux portion of the exam is performed with the patient in reverse Trendelenburg.  +---------+---------------+---------+-----------+----------+--------------+  RIGHT     Compressibility Phasicity Spontaneity Properties Thrombus Aging  +---------+---------------+---------+-----------+----------+--------------+  CFV       Partial         Yes       Yes                    Acute           +---------+---------------+---------+-----------+----------+--------------+  SFJ       Full                                                             +---------+---------------+---------+-----------+----------+--------------+  FV Prox   Full                                                             +---------+---------------+---------+-----------+----------+--------------+  FV Mid    Full                                                             +---------+---------------+---------+-----------+----------+--------------+  FV Distal Full                                                             +---------+---------------+---------+-----------+----------+--------------+  PFV       Full                                                             +---------+---------------+---------+-----------+----------+--------------+  POP       Full            Yes       Yes                                    +---------+---------------+---------+-----------+----------+--------------+  PTV       Full                                                             +---------+---------------+---------+-----------+----------+--------------+  PERO      Full                                                             +---------+---------------+---------+-----------+----------+--------------+ Thrombus located in the common  femoral vein is noted to be in the distal segment.  +---------+---------------+---------+-----------+----------+--------------+  LEFT      Compressibility Phasicity Spontaneity Properties Thrombus Aging  +---------+---------------+---------+-----------+----------+--------------+  CFV       Full            Yes       Yes                                    +---------+---------------+---------+-----------+----------+--------------+  SFJ       Full                                                             +---------+---------------+---------+-----------+----------+--------------+  FV Prox   Full                                                             +---------+---------------+---------+-----------+----------+--------------+  FV Mid    Full                                                             +---------+---------------+---------+-----------+----------+--------------+  FV Distal Full                                                             +---------+---------------+---------+-----------+----------+--------------+  PFV       Full                                                             +---------+---------------+---------+-----------+----------+--------------+  POP       Full            Yes       Yes                                    +---------+---------------+---------+-----------+----------+--------------+  PTV       Full                                                             +---------+---------------+---------+-----------+----------+--------------+  PERO      Full                                                             +---------+---------------+---------+-----------+----------+--------------+     Summary: RIGHT: - Findings consistent with acute deep vein thrombosis involving the right common femoral vein. - No cystic structure found in the popliteal fossa.  LEFT: - There is no evidence of deep vein thrombosis in the lower extremity. However, portions of this examination were limited- see  technologist comments above.  - No cystic structure found in the popliteal fossa.  *See table(s) above for measurements and observations. Electronically signed by Harold Barban MD on 11/28/2021 at 11:29:25 PM.    Final    ECHOCARDIOGRAM LIMITED  Result Date: 11/28/2021    ECHOCARDIOGRAM LIMITED REPORT   Patient Name:   HANI PATNODE Date of Exam: 11/28/2021 Medical Rec #:  [id number]       Height:       63.0 in Accession #:    [id number]      Weight:       95.0 lb Date of Birth:  03-Apr-1922       BSA:          1.409 m Patient Age:    53 years        BP:           119/50 mmHg Patient Gender: F               HR:           91 bpm. Exam Location:  Inpatient Procedure: Limited Echo, Limited Color Doppler and Cardiac Doppler Indications:    acute diastolic chf  History:        Patient has prior history of Echocardiogram examinations, most                 recent 06/04/2006. COPD and Covid. chronic kidney disease,                 Arrythmias:Atrial Fibrillation; Risk Factors:Diabetes,                 Dyslipidemia and Hypertension.  Sonographer:    Johny Chess RDCS Referring Phys: Wentworth Hunter  1. Left ventricular ejection fraction, by estimation, is 60 to 65%. The left ventricle has normal function. The left ventricle has no regional wall motion abnormalities.  2. Right ventricular systolic function is normal. The right ventricular size is normal. There is moderately elevated pulmonary artery systolic pressure.  3. Left atrial size was severely dilated.  4. Right atrial size was severely dilated.  5. The mitral valve is abnormal. Moderate mitral valve regurgitation.  6. There is moderate calcification of the aortic valve. There is moderate thickening of the aortic valve. Aortic valve regurgitation is severe. Mild aortic valve stenosis with AVA 1.3cm2, mean gradient 26mmHg, Vmax 2.22m/s, DI 0.4.  7. The inferior vena cava is normal in size with greater than 50% respiratory variability, suggesting  right atrial pressure of 3 mmHg. Comparison(s): Compared to prior echo report in 2007, there is now severe AR, mild AS, moderate MR (previously no significant valve disease). FINDINGS  Left Ventricle: Left ventricular ejection fraction, by estimation, is 60 to 65%. The left ventricle has normal function. The  left ventricle has no regional wall motion abnormalities. The left ventricular internal cavity size was normal in size. There is  no left ventricular hypertrophy. Right Ventricle: The right ventricular size is normal. Right ventricular systolic function is normal. There is moderately elevated pulmonary artery systolic pressure. The tricuspid regurgitant velocity is 3.26 m/s, and with an assumed right atrial pressure of 3 mmHg, the estimated right ventricular systolic pressure is 76.8 mmHg. Left Atrium: Left atrial size was severely dilated. Right Atrium: Right atrial size was severely dilated. Mitral Valve: The mitral valve is abnormal. There is mild thickening of the mitral valve leaflet(s). There is mild calcification of the mitral valve leaflet(s). Mild mitral annular calcification. Moderate mitral valve regurgitation. Tricuspid Valve: The tricuspid valve is normal in structure. Tricuspid valve regurgitation is mild. Aortic Valve: AVA 1.3cm2 by continuity, mean gradient 69mmHg, Vmax 2.69m/s, DI 0.4. There is moderate calcification of the aortic valve. There is moderate thickening of the aortic valve. Aortic valve regurgitation is moderate to severe. Mild aortic stenosis is present. Pulmonic Valve: The pulmonic valve was normal in structure. Pulmonic valve regurgitation is trivial. Aorta: The aortic root and ascending aorta are structurally normal, with no evidence of dilitation. Venous: The inferior vena cava is normal in size with greater than 50% respiratory variability, suggesting right atrial pressure of 3 mmHg. LEFT VENTRICLE PLAX 2D LVIDd:         3.70 cm LVIDs:         2.60 cm LV PW:         0.80 cm LV  IVS:        0.70 cm LVOT diam:     1.50 cm LVOT Area:     1.77 cm  IVC IVC diam: 1.60 cm LEFT ATRIUM         Index LA diam:    4.10 cm 2.91 cm/m   AORTA Ao Root diam: 2.80 cm Ao Asc diam:  2.60 cm TRICUSPID VALVE TR Peak grad:   42.5 mmHg TR Vmax:        326.00 cm/s  SHUNTS Systemic Diam: 1.50 cm Gwyndolyn Kaufman MD Electronically signed by Gwyndolyn Kaufman MD Signature Date/Time: 11/28/2021/6:00:53 PM    Final

## 2021-11-30 ENCOUNTER — Encounter (HOSPITAL_COMMUNITY): Payer: Self-pay

## 2021-11-30 ENCOUNTER — Other Ambulatory Visit (HOSPITAL_COMMUNITY): Payer: Self-pay

## 2021-11-30 DIAGNOSIS — Z7189 Other specified counseling: Secondary | ICD-10-CM | POA: Diagnosis not present

## 2021-11-30 DIAGNOSIS — Z66 Do not resuscitate: Secondary | ICD-10-CM

## 2021-11-30 DIAGNOSIS — Z515 Encounter for palliative care: Secondary | ICD-10-CM | POA: Diagnosis not present

## 2021-11-30 DIAGNOSIS — J9601 Acute respiratory failure with hypoxia: Secondary | ICD-10-CM | POA: Diagnosis not present

## 2021-11-30 LAB — GLUCOSE, CAPILLARY
Glucose-Capillary: 155 mg/dL — ABNORMAL HIGH (ref 70–99)
Glucose-Capillary: 144 mg/dL — ABNORMAL HIGH (ref 70–99)

## 2021-11-30 LAB — COMPREHENSIVE METABOLIC PANEL
ALT: 8 U/L (ref 0–44)
AST: 19 U/L (ref 15–41)
Albumin: 1.7 g/dL — ABNORMAL LOW (ref 3.5–5.0)
Alkaline Phosphatase: 23 U/L — ABNORMAL LOW (ref 38–126)
Anion gap: 10 (ref 5–15)
BUN: 43 mg/dL — ABNORMAL HIGH (ref 8–23)
CO2: 23 mmol/L (ref 22–32)
Calcium: 8.3 mg/dL — ABNORMAL LOW (ref 8.9–10.3)
Chloride: 101 mmol/L (ref 98–111)
Creatinine, Ser: 0.88 mg/dL (ref 0.44–1.00)
GFR, Estimated: 60 mL/min — ABNORMAL LOW (ref >60)
Glucose, Bld: 116 mg/dL — ABNORMAL HIGH (ref 70–99)
Potassium: 3.9 mmol/L (ref 3.5–5.1)
Sodium: 134 mmol/L — ABNORMAL LOW (ref 135–145)
Total Bilirubin: 0.4 mg/dL (ref 0.3–1.2)
Total Protein: 5 g/dL — ABNORMAL LOW (ref 6.5–8.1)

## 2021-11-30 LAB — CBC WITH DIFFERENTIAL/PLATELET
Abs Immature Granulocytes: 0.75 10*3/uL — ABNORMAL HIGH (ref 0.00–0.07)
Basophils Absolute: 0 10*3/uL (ref 0.0–0.1)
Basophils Relative: 0 %
Eosinophils Absolute: 0 10*3/uL (ref 0.0–0.5)
Eosinophils Relative: 0 %
HCT: 28.4 % — ABNORMAL LOW (ref 36.0–46.0)
Hemoglobin: 9.2 g/dL — ABNORMAL LOW (ref 12.0–15.0)
Immature Granulocytes: 4 %
Lymphocytes Relative: 5 %
Lymphs Abs: 0.8 10*3/uL (ref 0.7–4.0)
MCH: 30.2 pg (ref 26.0–34.0)
MCHC: 32.4 g/dL (ref 30.0–36.0)
MCV: 93.1 fL (ref 80.0–100.0)
Monocytes Absolute: 1 10*3/uL (ref 0.1–1.0)
Monocytes Relative: 6 %
Neutro Abs: 15.3 10*3/uL — ABNORMAL HIGH (ref 1.7–7.7)
Neutrophils Relative %: 85 %
Platelets: 417 10*3/uL — ABNORMAL HIGH (ref 150–400)
RBC: 3.05 MIL/uL — ABNORMAL LOW (ref 3.87–5.11)
RDW: 18.5 % — ABNORMAL HIGH (ref 11.5–15.5)
WBC: 17.9 10*3/uL — ABNORMAL HIGH (ref 4.0–10.5)
nRBC: 0 % (ref 0.0–0.2)

## 2021-11-30 LAB — C-REACTIVE PROTEIN: CRP: 10.3 mg/dL — ABNORMAL HIGH (ref <1.0)

## 2021-11-30 LAB — EXPECTORATED SPUTUM ASSESSMENT W GRAM STAIN, RFLX TO RESP C

## 2021-11-30 LAB — D-DIMER, QUANTITATIVE: D-Dimer, Quant: 2.01 ug/mL-FEU — ABNORMAL HIGH (ref 0.00–0.50)

## 2021-11-30 LAB — BRAIN NATRIURETIC PEPTIDE: B Natriuretic Peptide: 931.4 pg/mL — ABNORMAL HIGH (ref 0.0–100.0)

## 2021-11-30 LAB — HEPARIN LEVEL (UNFRACTIONATED): Heparin Unfractionated: 0.75 IU/mL — ABNORMAL HIGH (ref 0.30–0.70)

## 2021-11-30 LAB — PROCALCITONIN: Procalcitonin: 3.73 ng/mL

## 2021-11-30 LAB — MAGNESIUM: Magnesium: 3 mg/dL — ABNORMAL HIGH (ref 1.7–2.4)

## 2021-11-30 MED ORDER — APIXABAN 5 MG PO TABS
10.0000 mg | ORAL_TABLET | Freq: Two times a day (BID) | ORAL | Status: DC
  Administered 2021-11-30 – 2021-12-02 (×5): 10 mg via ORAL
  Filled 2021-11-30 (×6): qty 2

## 2021-11-30 MED ORDER — LOPERAMIDE HCL 2 MG PO CAPS
4.0000 mg | ORAL_CAPSULE | Freq: Once | ORAL | Status: AC
  Administered 2021-11-30: 4 mg via ORAL
  Filled 2021-11-30: qty 2

## 2021-11-30 MED ORDER — FENTANYL CITRATE PF 50 MCG/ML IJ SOSY
12.5000 ug | PREFILLED_SYRINGE | Freq: Four times a day (QID) | INTRAMUSCULAR | Status: DC | PRN
  Administered 2021-11-30 – 2021-12-02 (×6): 12.5 ug via INTRAVENOUS
  Filled 2021-11-30 (×6): qty 1

## 2021-11-30 MED ORDER — FUROSEMIDE 10 MG/ML IJ SOLN
60.0000 mg | Freq: Once | INTRAMUSCULAR | Status: AC
  Administered 2021-11-30: 60 mg via INTRAVENOUS
  Filled 2021-11-30: qty 6

## 2021-11-30 MED ORDER — APIXABAN 5 MG PO TABS: 5.0000 mg | ORAL_TABLET | Freq: Two times a day (BID) | ORAL | Status: DC

## 2021-11-30 NOTE — Consult Note (Signed)
Palliative Medicine Inpatient Consult Note  Consulting Provider:  Reason for consult:   [name] Consult  Reason for Consult? GOC   HPI:  Per intake H&P --> 88 y.o. female with medical history significant of glaucoma, macular degeneration, carotid stenosis, anemia, CKD 3, COPD, diabetes, hypertension, hyperlipidemia, prolactinoma, GERD, IBS who presents with ongoing shortness of breath and cough.  In the ER she was diagnosed with acute hypoxic respiratory failure with sats below 85% on room air.  She was diagnosed with pneumonia due to bacterial infection along with COVID-19 infection and admitted to the hospital.  Palliative care has been asked to get involved to discuss goals of care in the setting of decreased functional state and failure to thrive.  Clinical Assessment/Goals of Care:  *Please note that this is a verbal dictation therefore any spelling or grammatical errors are due to the "[place] One" system interpretation.  I have reviewed medical records including EPIC notes, labs and imaging, received report from bedside RN, assessed the patient who was lying in bed in moderate distress associated with her shortness of breath   I met with [name] and her daughter, Cyndy Freeze to further discuss diagnosis prognosis, GOC, EOL wishes, disposition and options.   I introduced Palliative Medicine as specialized medical care for people living with serious illness. It focuses on providing relief from the symptoms and stress of a serious illness. The goal is to improve quality of life for both the patient and the family.  Medical History Review and Understanding: Shelia Holt understands that she is "not doing too good".  I asked her to share with me what she meant by that comment.  She expresses that she is feeling like she is having a very hard time breathing.  We reviewed her history of COPD as well as her acute COVID-19 infection, and  active bacterial pneumonia.  I shared that this is likely the reason why she is having a very difficult time breathing as well as having moist secretions which she is expectorating through cough.  Educated Programme researcher, broadcasting/film/video and her daughter that [name] in the elderly is very serious and can often lead to a declining health state overall inclusive of muscular weakness and profound failure to thrive.   Patient's daughter and I reviewed [name]'s additional chronic conditions inclusive of her diabetes, hypertension, macular degeneration.  Social History: [name] shares with me that she is from [place], [place].  She has lived in [place] throughout the majority of her time here.  She is a widow though had been married to her husband for 42 years before his passing in 51.  She has 1 daughter, Shelia Holt.  She 7 grandchildren and great-grandchildren altogether.  She worked as a Network engineer for [employer]  She is a woman of faith and practices within the [facility] denomination.  Functional and Nutritional State: Per patient's daughter, [name] has had a worsening functional state over the past 6 months or so.  It has become more pronounced and difficult for her to mobilize without the aid of a walker within the last 6 weeks.  She has a difficult time with mobility as she has "eyes which cannot read or see".  This is in the setting of her macular degeneration.  Patient's daughter, Shelia Holt helps her with all basic activities of daily living though patient has been able to feed herself.  [name] has had very little nutritional intake over the past 6 weeks per her daughter "I could count  all that she is eaten and 1 cup". Palliative Symptoms:  Advance Directives: A copy of advance directives was obtained and scanned into [facility].   Code Status: Concepts specific to code status, artifical feeding and hydration, continued IV antibiotics and rehospitalization was had.    Cardiopulmonary Resuscitation: Do Not  Attempt Resuscitation (DNR/No CPR)  Medical Interventions: Comfort Measures: Keep clean, warm, and dry. Use medication by any route, positioning, wound care, and other measures to relieve pain and suffering. Use oxygen, suction and manual treatment of airway obstruction as needed for comfort. Do not transfer to the hospital unless comfort needs cannot be met in current location.  Antibiotics: Determine use of limitation of antibiotics when infection occurs  IV Fluids: No IV fluids (provide other measures to ensure comfort)  Feeding Tube: No feeding tube   In terms of patient's understanding of her illness as she is aware that she is quite declined from her prior physical state.  Per discussion with she and her daughter I shared I worry she will not be on earth for very long in the state of her acute on chronic conditions.  We reviewed that we could either pursue aggressive modalities of care which are unlikely to result in anything beyond additional prodding and poking.  Or we could transition her focus to more of a comfort oriented path which would mean her life on earth would be short though hopefully she would be able to be in her most comfortable environment enjoying time with friends and family as she exits this life. Shondrea was very much in favor of being comfortable in her home.  I described hospice as a service for patients who have a life expectancy of 6 months or less.   The goal of hospice is the preservation of dignity and quality at the end phases of life.   Under hospice care, the focus changes from curative to symptom relief.   Goals for the Future: Patient's goal is to get home.  She is aware of hospice concepts and values and is in agreement with this mode of care.  Discussed the importance of continued conversation with family and their  medical providers regarding overall plan of care and treatment options, ensuring decisions are within the context of the patients values and  GOCs. ___________________________________ Addendum:  Patients daughter, Shelia Holt and I spoke outside of the room for sometime. We reviewed Vernica's declining state over the past year. Discussed the trajectory of illness in Geriatric patients. Her daughter expresses knowledge of her mothers, "dying" and that the process started months ago. She did not want to be the reason why her mother passed. I allowed her time to express herself. I shared that at this moment she has done everything right to help her mother. Provided therapeutic communication. Provided information on death and dying.  Decision Maker: Cyndy Freeze (daughter) - [phone]  SUMMARY OF RECOMMENDATIONS   DNAR/DNI  MOST Completed, paper copy placed onto the chart electric copy can be found in [facility]  DNR Form Completed, paper copy placed onto the chart electric copy can be found in [place] scanned into [place] consult  Plan for discharge to home with hospice on Sunday. This will allow for two additional days of treatment.  Ongoing Palliative care support  Code Status/Advance Care Planning: DNAR/DNI   Symptom Management:   Palliative Prophylaxis:  Aspiration, Bowel Regimen, Delirium Protocol, Frequent Pain Assessment, Oral Care, Palliative Wound Care, and Turn Reposition  Additional Recommendations (  Limitations, Scope, Preferences): Transition focus to comfort on discharge  Psycho-social/Spiritual:  Desire for further Chaplaincy support: NO Additional Recommendations: Education on acute disease processes   Prognosis: Very poor in the setting of declining functional state. Very severe frailty. Hypoalbuminemia. COVID PNA. < 6 month prognosis  Discharge Planning: Discharge to home with hospice.   Vitals:   11/29/21 2010 11/30/21 0318  BP: 116/60 (!) 121/55  Pulse: 71 77  Resp: 20 20  Temp: 97.7 F (36.5 C) 97.7 F (36.5 C)  SpO2: 100% 100%    Intake/Output Summary (Last 24  hours) at 11/30/2021 8185 Last data filed at 11/30/2021 0400 Gross per 24 hour  Intake 887.23 ml  Output 950 ml  Net -62.77 ml   Last Weight  Most recent update: 11/30/2021  3:23 AM    Weight  44.1 kg (97 lb 3.6 oz)             Gen:  Very frail elderly F in moderate distress HEENT: Dry mucous membranes CV: Regular rate and rhythm  PULM: On 4LPM [place], (+) rhonchi ABD: soft/nontender EXT: No edema Neuro: Alert and oriented x2  PPS: 30%   This conversation/these recommendations were discussed with patient primary care team, Dr. Candiss Norse  Time In: 0920 Time Out: 1130 Total Time: 130  ______________________________________________________ [place] Team Team Cell Phone: [phone] Please utilize secure chat with additional questions, if there is no response within 30 minutes please call the above phone number  Palliative Medicine Team providers are available by phone from 7am to 7pm daily and can be reached through the team cell phone.  Should this patient require assistance outside of these hours, please call the patient's attending physician.

## 2021-11-30 NOTE — Progress Notes (Signed)
°   11/30/21 1154  Assess: MEWS Score  Temp 97.6 F (36.4 C)  BP (!) 153/74  Pulse Rate 84  ECG Heart Rate 94  Resp (!) 33  Level of Consciousness Alert  SpO2 99 %  O2 Device Nasal Cannula  Assess: MEWS Score  MEWS Temp 0  MEWS Systolic 0  MEWS Pulse 0  MEWS RR 2  MEWS LOC 0  MEWS Score 2  MEWS Score Color Yellow  Assess: if the MEWS score is Yellow or Red  Were vital signs taken at a resting state? Yes  Focused Assessment No change from prior assessment  Early Detection of Sepsis Score *See Row Information* Low  MEWS guidelines implemented *See Row Information* Yes  Treat  MEWS Interventions Administered prn meds/treatments  Take Vital Signs  Increase Vital Sign Frequency  Yellow: Q 2hr X 2 then Q 4hr X 2, if remains yellow, continue Q 4hrs  Escalate  MEWS: Escalate Yellow: discuss with charge nurse/RN and consider discussing with provider and RRT  Notify: Charge Nurse/RN  Name of Charge Nurse/RN Notified Marylou Flesher RN  Date Charge Nurse/RN Notified 11/30/21  Time Charge Nurse/RN Notified 1345  Notify: Provider  Provider Name/Title Lala Lund MD  Date Provider Notified 11/30/21  Time Provider Notified 1200  Notification Type Page  Notification Reason Change in status;Requested by patient/family  Provider response See new orders  Date of Provider Response 11/30/21  Time of Provider Response 1200  Document  Patient Outcome Other (Comment) (Remains appropriate for this unit.)

## 2021-11-30 NOTE — Progress Notes (Addendum)
ANTICOAGULATION CONSULT NOTE - Follow Up Consult  Pharmacy Consult for Heparin >Transtion to Eliquis (see addendum) Indication: DVT and possible PE  Allergies  Allergen Reactions   Celecoxib Palpitations    palpitations   Rofecoxib Palpitations    palpitations palpitations   Gabapentin Other (See Comments)    confusion   Levaquin [Levofloxacin In D5w] Other (See Comments)    aching   Levofloxacin Other (See Comments)    Aching, Muscle weakness Other reaction(s): Other (See Comments) Aching aching Muscle weakness   Nortriptyline Other (See Comments)    Change in Behavior Other reaction(s): Other (See Comments) Change in Behavior   Nortriptyline Hcl Other (See Comments)    Change in behavior Change in behavior    Patient Measurements: Height: 5\' 4"  (162.6 cm) Weight: 44.1 kg (97 lb 3.6 oz) IBW/kg (Calculated) : 54.7 Heparin Dosing Weight: 44.1 kg  Vital Signs: Temp: 97.7 F (36.5 C) (01/06 0318) Temp Source: Oral (01/06 0318) BP: 121/55 (01/06 0318) Pulse Rate: 77 (01/06 0318)  Labs: Recent Labs    11/27/21 1731 11/28/21 0201 11/28/21 1649 11/28/21 1649 11/29/21 0032 11/29/21 1058 11/29/21 2027 11/30/21 0441  HGB 8.4* 7.5* 9.0*  --  8.6*  --   --  9.2*  HCT 27.3* 24.5* 29.6*  --  26.5*  --   --  28.4*  PLT 375 328 351  --  344  --   --  417*  APTT 30  --   --   --   --   --   --   --   LABPROT 14.5  --   --   --   --   --   --   --   INR 1.1  --   --   --   --   --   --   --   HEPARINUNFRC  --   --   --    < > <0.10* 0.11* 0.32 0.75*  CREATININE 1.05* 0.91  --   --  1.01*  --   --  0.88   < > = values in this interval not displayed.     Estimated Creatinine Clearance: 25.4 mL/min (by C-G formula based on SCr of 0.88 mg/dL).  Assessment: 89 YOF presenting 11/27/20 with SOB, COVID +, elevated D-dimer and CT angio chest with possible embolic disease.  Lovenox 30mg  DVT prophylactis dose given 1/3 @2230 . Changed to IV heparin 1/4 pm. Duplex 1/4 showed RLE  DVT.   FOB+ stools, recent and continued hemorrhoidal bleeding.  Transfused 1/4.  To monitor closely for worsening of bleeding and tolerance for continued anticoagulation.  Will target low therapeutic heparin levels.  Heparin level 0.75 today on heparin 950 units/hr,  which is above target goal 0.3-0.5 units/ml.  Recent hemorrhoidal bleeding, RN reports no blood on pad this morning.     Goal of Therapy:  Heparin level 0.3-0.5 units/ml  Monitor platelets by anticoagulation protocol: Yes    Plan:  Decrease heparin drip to 800 units/hr Check 6 hour HL. Daily heparin level and CBC  Monitor hemorrhoidal bleeding.   Nicole Cella, RPh Clinical Pharmacist [phone] 11/30/2021 8:38 AM  Please check AMION for all [place] phone numbers After 10:00 PM, call [place] [phone]  ADDENDUM:  pharmacy consulted to transition IV anticoagulation to Eliquis for DVT/PE.   Plan:   Dc heparin , transition to Apixaban 10mg  BID x7 days then on 1/13 reduce to 5 mg BID. AVS eliquis education form posted Education completed with  patient's daughter, Cyndy Freeze via phone. -current 30 day co-pay is, $502.28 due to a $488.96 deductible remaining. Patient's daughter, Cyndy Freeze is aware of copay and will f/u with insurance for copay amount once deductible reached. She prefers to stay with Elquis rather than warfarin (warfarin copay $16.54).   Nicole Cella, RPh Clinical Pharmacist [phone] 11/30/2021 9:11 AM

## 2021-11-30 NOTE — Progress Notes (Signed)
[place] [address] [facility]) Hospital Liaison Note   Received request from Transitions of Care Manager, Neoma Laming, for hospice services at home after discharge. Chart and patient information under review by [facility] physician. Hospice eligibility is pending.   Spoke with Coralyn Mark [phone] to initiate education related to hospice philosophy, services, and team approach to care. Patient/family verbalized understanding of information given. Per discussion, the plan is for patient to discharge home via private vehicle or PTAR   DME needs discussed. Patient has the following equipment in the home (Purchased privately): Walker Patient requests the following equipment for delivery: Oxygen (4L/min) Bedside Commode  Address verified and is correct in the chart. Coralyn Mark [phone] is the family member to contact to arrange time of equipment delivery.    Please send signed and completed DNR home with patient/family. Please provide prescriptions at discharge as needed to ensure ongoing symptom management.    [facility] information and contact numbers given to Above information shared with TOC.   Please call with any questions/concerns.    Thank you for the opportunity to participate in this patient's care.   Daphene Calamity, MSW [facility] Liaison  [phone]

## 2021-11-30 NOTE — Progress Notes (Signed)
Physical Therapy Treatment & Discharge Patient Details Name: Shelia Holt MRN: [id number] DOB: Jul 07, 1922 Today's Date: 11/30/2021   History of Present Illness 88 y.o. female with medical history significant of glaucoma, macular degeneration, carotid stenosis, anemia, CKD 3, COPD, diabetes, hypertension, hyperlipidemia, prolactinoma, GERD, IBS admitted with acute hypoxic respiratory failure with sats below 85% on RA.  Positive for pneumonia with Covid-19 infection.    PT Comments    Pt planning to go home with hospice. Confirmed with MD prior to session that MD desired for pt to receive 1 additional PT session then for PT to sign off. Focused session on inquiring about DME needs and educating pt's daughter, who will be the pt's caregiver, how to provide assistance with bed mobility and transfers in correct, safe techniques. Pt able to perform bed mobility with minA and transfers to stand with UE support with modA today. Educated pt's daughter on use of gait belt provided and on pressure relief, floating heels and rolling at least every 2 hrs. All education complete and questions answered. PT will sign off. Pt will need a 3-in-1 at d/c.   Recommendations for follow up therapy are one component of a multi-disciplinary discharge planning process, led by the attending physician.  Recommendations may be updated based on patient status, additional functional criteria and insurance authorization.  Follow Up Recommendations  Home health PT (planning for home with hospice)     Assistance Recommended at Discharge Frequent or constant Supervision/Assistance  Patient can return home with the following A lot of help with bathing/dressing/bathroom;A lot of help with walking and/or transfers;Help with stairs or ramp for entrance;Direct supervision/assist for medications management;Assist for transportation;Direct supervision/assist for financial management;Assistance with cooking/housework   Equipment  Recommendations  BSC/3in1    Recommendations for Other Services       Precautions / Restrictions Precautions Precautions: Fall Precaution Comments: watch O2, RR Restrictions Weight Bearing Restrictions: No     Mobility  Bed Mobility Overal bed mobility: Needs Assistance Bed Mobility: Rolling;Sidelying to Sit;Sit to Supine Rolling: Min assist Sidelying to sit: Min assist;HOB elevated (slightly elevated)   Sit to supine: Mod assist   General bed mobility comments: Cues provided and demonstration provided to pt's daughter on proper body positioning and techniques to assist pt safely. Cues provided for pt to flex knees and reach UE across to EOB to roll, minA to complete. Cues provided to bring legs off EOB, needing asisstance to initiate, and to push up from bed, minA. HOB slightly elevated to simulate prop up of pillows at home. ModA to manage legs and control trunk with return to supine. Provided daughter with written explanation of all bed mobility techniques.    Transfers Overall transfer level: Needs assistance Equipment used: 1 person hand held assist Transfers: Sit to/from Stand Sit to Stand: Mod assist           General transfer comment: Cues provided and demonstration provided to pt's daughter on proper body positioning and techniques to assist pt safely. Provided daughter with written explanation of all bed mobility techniques. Cued pt to push up from bed and hold onto waist of PT, modA.    Ambulation/Gait               General Gait Details: Pt declined   Stairs             Wheelchair Mobility    Modified Rankin (Stroke Patients Only)       Balance Overall balance assessment: Needs assistance Sitting-balance support: Feet  unsupported Sitting balance-Leahy Scale: Fair Sitting balance - Comments: Static sitting EOB with supervision for safety.   Standing balance support: Bilateral upper extremity supported Standing balance-Leahy Scale:  Poor Standing balance comment: UE support in standing statically briefly.                            Cognition Arousal/Alertness: Awake/alert Behavior During Therapy: Flat affect Overall Cognitive Status: History of cognitive impairments - at baseline                                 General Comments: Pt repeating comments, forgetting what we were doing repeatedly while doing it.        Exercises      General Comments General comments (skin integrity, edema, etc.): RR up to 20s, SpO2 stable on supplemental O2; discussed DME needs      Pertinent Vitals/Pain Pain Assessment: Faces Faces Pain Scale: Hurts a little bit Pain Location: generalized with mobility Pain Descriptors / Indicators: Grimacing;Guarding Pain Intervention(s): Limited activity within patient's tolerance;Monitored during session;Repositioned    Home Living                          Prior Function            PT Goals (current goals can now be found in the care plan section) Acute Rehab PT Goals Patient Stated Goal: to go home with hospice PT Goal Formulation: With patient/family Time For Goal Achievement: 12/12/21 Potential to Achieve Goals: Fair Progress towards PT goals:  (MD confirming final PT treatment as pt d/c'ing home with hospice, pt discharged from PT now)    Frequency    Min 3X/week      PT Plan Discharge plan needs to be updated    Co-evaluation              AM-PAC PT "6 Clicks" Mobility   Outcome Measure  Help needed turning from your back to your side while in a flat bed without using bedrails?: A Little Help needed moving from lying on your back to sitting on the side of a flat bed without using bedrails?: A Little Help needed moving to and from a bed to a chair (including a wheelchair)?: A Lot Help needed standing up from a chair using your arms (e.g., wheelchair or bedside chair)?: A Lot Help needed to walk in hospital room?:  Total Help needed climbing 3-5 steps with a railing? : Total 6 Click Score: 12    End of Session   Activity Tolerance: Patient limited by fatigue Patient left: in bed;with call bell/phone within reach;with bed alarm set Nurse Communication: Mobility status PT Visit Diagnosis: Muscle weakness (generalized) (M62.81);Difficulty in walking, not elsewhere classified (R26.2)     Time: 1610-9604 PT Time Calculation (min) (ACUTE ONLY): 31 min  Charges:  $Therapeutic Activity: 23-37 mins                     Moishe Spice, PT, DPT Acute Rehabilitation Services  Pager: [phone] Office: [place] 11/30/2021, 3:26 PM

## 2021-11-30 NOTE — Progress Notes (Signed)
OT Cancellation Note  Patient Details Name: Shelia Holt MRN: [id number] DOB: 02/20/1922   Cancelled Treatment:    Reason Eval/Treat Not Completed: Other (comment) (Pt/family planning to go home with hospice and confirmed with MD). OT will sign off  Britt Bottom 11/30/2021, 3:32 PM

## 2021-11-30 NOTE — Care Management Important Message (Signed)
Important Message  Patient Details  Name: Shelia Holt MRN: [id number] Date of Birth: 19-Jan-1922   Medicare Important Message Given:  Yes     Shelda Altes 11/30/2021, 10:20 AM

## 2021-11-30 NOTE — TOC Initial Note (Signed)
Transition of Care [facility]) - Initial/Assessment Note    Patient Details  Name: Shelia Holt MRN: [id number] Date of Birth: 1922-08-04  Transition of Care [facility]) CM/SW Contact:    Zenon Mayo, RN Phone Number: 11/30/2021, 4:39 PM  Clinical Narrative:                 NCM spoke with Terri, patient's daughter, she states patient has a walker at home, and she does not want a hospital bed.  Patient probably will need home oxygen and BSC and ambulance transport.  NCM offered choice, for home hospice, daughter states she is fine with [facility].  NCM made referral to [facility] with [facility].  Terrie's cell phone is [phone].      Expected Discharge Plan: Home w Hospice Care Barriers to Discharge: Continued Medical Work up   Patient Goals and CMS Choice Patient states their goals for this hospitalization and ongoing recovery are:: home with hospice CMS Medicare.gov Compare Post Acute Care list provided to:: Patient Represenative (must comment) Choice offered to / list presented to : Adult Children  Expected Discharge Plan and Services Expected Discharge Plan: [place]   Discharge Planning Services: CM Consult   Living arrangements for the past 2 months: Single Family Home                   DME Agency: NA       HH Arranged: RN [place] Agency:  Teacher, early years/pre) Date HH Agency Contacted: 11/30/21 Time [place]: [phone] Representative spoke with at [place]: Lorayne Bender  Prior Living Arrangements/Services Living arrangements for the past 2 months: [place] with:: Self Patient language and need for interpreter reviewed:: Yes Do you feel safe going back to the place where you live?: Yes      Need for Family Participation in Patient Care: Yes (Comment) Care giver support system in place?: Yes (comment) Current home services: DME (walker) Criminal Activity/Legal Involvement Pertinent to Current Situation/Hospitalization: No - Comment as  needed  Activities of Daily Living Home Assistive Devices/Equipment: Cane (specify quad or straight), Walker (specify type), Shower chair with back ADL Screening (condition at time of admission) Patient's cognitive ability adequate to safely complete daily activities?: Yes Is the patient deaf or have difficulty hearing?: No Does the patient have difficulty seeing, even when wearing glasses/contacts?: Yes Does the patient have difficulty concentrating, remembering, or making decisions?: No Patient able to express need for assistance with ADLs?: Yes Does the patient have difficulty dressing or bathing?: Yes Independently performs ADLs?: No Does the patient have difficulty walking or climbing stairs?: Yes Weakness of Legs: Both Weakness of Arms/Hands: Both  Permission Sought/Granted                  Emotional Assessment Appearance:: Appears stated age       Alcohol / Substance Use: Not Applicable    Admission diagnosis:  SOB (shortness of breath) [R06.02] Acute respiratory failure with hypoxia ([place]) [J96.01] Acute on chronic respiratory failure with hypoxia ([place]) [J96.21] COVID-19 [U07.1] Patient Active Problem List   Diagnosis Date Noted   Acute on chronic respiratory failure with hypoxia ([place]) 11/28/2021   Acute respiratory failure with hypoxia ([place]) 11/27/2021   PNA (pneumonia) 11/27/2021   COVID-19 virus infection 11/27/2021   Colonic intussusception ([place]) 02/02/2021   Syncope and collapse 12/27/2015   Dizziness and giddiness 12/27/2015   Prolactinoma ([place]) 12/27/2015   Spinal stenosis, lumbar region, with neurogenic claudication 12/27/2015  Diabetic polyneuropathy associated with type 2 diabetes mellitus ([place]) 12/27/2015   Dry eye syndrome of both lacrimal glands 10/10/2015   AMD (age related macular degeneration) 04/21/2014   Decreased vision in both eyes 04/13/2014   Trichiasis of eyelid without entropion 03/08/2014   Punctal ectropion 12/28/2013   Essential  hypertension 10/03/2013   Diabetes mellitus type 2, controlled ([place]) 10/03/2013   COPD (chronic obstructive pulmonary disease) ([place]) 10/03/2013   Asymptomatic stenosis of left carotid artery without infarction 10/03/2013   Anemia, iron deficiency 10/03/2013   Chronic kidney disease, stage 3 ([place]) 10/03/2013   Hyperlipidemia 10/03/2013   Keratitis sicca, both eyes ([place]) 09/14/2013   Choroidal detachment of right eye 04/16/2013   Corneal epithelial defect 02/01/2013   Other states following surgery of eye and adnexa 01/15/2013   Lens replaced by other means 12/23/2012   Primary open angle glaucoma of both eyes, severe stage 12/23/2012   Pseudophakia of both eyes 12/23/2012   Squamous cell carcinoma 12/04/2012   Skin cancer 12/02/2012   PCP:  Deland Pretty, MD Pharmacy:   [id number] [place], [address] [place] [address] Phone: [phone] Fax: [phone]     Social Determinants of Health (SDOH) Interventions    Readmission Risk Interventions No flowsheet data found.

## 2021-11-30 NOTE — TOC Progression Note (Signed)
Transition of Care [facility]) - Progression Note    Patient Details  Name: MYKERIA GARMAN MRN: [id number] Date of Birth: Jan 15, 1922  Transition of Care [facility]) CM/SW Contact  Zenon Mayo, RN Phone Number: 11/30/2021, 4:36 PM  Clinical Narrative:    NCM spoke with Terri, patient's daughter, she states patient has a walker at home, and she does not want a hospital bed.  Patient probably will need home oxygen and BSC and ambulance transport.  NCM offered choice, for home hospice, daughter states she is fine with [facility].  NCM made referral to [facility] with [facility].  Terrie's cell phone is [phone].     Expected Discharge Plan: Home w Hospice Care Barriers to Discharge: Continued Medical Work up  Expected Discharge Plan and Services Expected Discharge Plan: [place]   Discharge Planning Services: CM Consult   Living arrangements for the past 2 months: Single Family Home                   DME Agency: NA       HH Arranged: RN [place] Agency:  Teacher, early years/pre) Date HH Agency Contacted: 11/30/21 Time [place]: [phone] Representative spoke with at [place]: [place] ([place]) Interventions    Readmission Risk Interventions No flowsheet data found.

## 2021-11-30 NOTE — Plan of Care (Signed)
°  Problem: Activity: Goal: Ability to tolerate increased activity will improve Outcome: Progressing   Problem: Clinical Measurements: Goal: Ability to maintain a body temperature in the normal range will improve Outcome: Progressing   Problem: Respiratory: Goal: Ability to maintain adequate ventilation will improve Outcome: Progressing   Problem: Education: Goal: Knowledge of General Education information will improve Description: Including pain rating scale, medication(s)/side effects and non-pharmacologic comfort measures Outcome: Progressing

## 2021-11-30 NOTE — Discharge Instructions (Addendum)
Disposition.  Home hospice Condition.  Guarded CODE STATUS.  DNR Activity.  With assistance as tolerated, full fall precautions. Diet.  Soft with feeding assistance and aspiration precautions. Goal of care.  Comfort.    Information on my medicine - ELIQUIS (apixaban)  This medication education was reviewed with me or my healthcare representative as part of my discharge preparation.    Why was Eliquis prescribed for you? Eliquis was prescribed to treat blood clots that may have been found in the veins of your legs (deep vein thrombosis) or in your lungs (pulmonary embolism) and to reduce the risk of them occurring again.  What do You need to know about Eliquis ? The starting dose is 10 mg (two 5 mg tablets) taken TWICE daily for the FIRST SEVEN (7) DAYS, then on 12/07/2021 the dose is reduced to ONE 5 mg tablet taken TWICE daily.  Eliquis may be taken with or without food.   Try to take the dose about the same time in the morning and in the evening. If you have difficulty swallowing the tablet whole please discuss with your pharmacist how to take the medication safely.  Take Eliquis exactly as prescribed and DO NOT stop taking Eliquis without talking to the doctor who prescribed the medication.  Stopping may increase your risk of developing a new blood clot.  Refill your prescription before you run out.  After discharge, you should have regular check-up appointments with your healthcare provider that is prescribing your Eliquis.    What do you do if you miss a dose? If a dose of ELIQUIS is not taken at the scheduled time, take it as soon as possible on the same day and twice-daily administration should be resumed. The dose should not be doubled to make up for a missed dose.  Important Safety Information A possible side effect of Eliquis is bleeding. You should call your healthcare provider right away if you experience any of the following: Bleeding from an injury or your nose  that does not stop. Unusual colored urine (red or dark brown) or unusual colored stools (red or black). Unusual bruising for unknown reasons. A serious fall or if you hit your head (even if there is no bleeding).  Some medicines may interact with Eliquis and might increase your risk of bleeding or clotting while on Eliquis. To help avoid this, consult your healthcare provider or pharmacist prior to using any new prescription or non-prescription medications, including herbals, vitamins, non-steroidal anti-inflammatory drugs (NSAIDs) and supplements.  This website has more information on Eliquis (apixaban): http://www.eliquis.com/eliquis/home

## 2021-11-30 NOTE — TOC Benefit Eligibility Note (Signed)
Patient Teacher, English as a foreign language completed.    The patient is currently admitted and upon discharge could be taking Eliquis 5 mg.  The current 30 day co-pay is, $502.28 due to a $488.96 deductible remaining.   The patient is currently admitted and upon discharge could be taking Xarelto 20 mg.  The current 30 day co-pay is, $493.54 due to a $488.96 deductible remaining.   The patient is currently admitted and upon discharge could be taking Warfarin 5 mg.  The current 30 day co-pay is, $16.54 due to a $488.96 deductible remaining.    The patient is insured through [place], [place] Patient Advocate Specialist [place] Patient Advocate Team Direct Number: [phone]  Fax: [phone]

## 2021-11-30 NOTE — Progress Notes (Addendum)
PROGRESS NOTE                                                                                                                                                                                                             Patient Demographics:    Shelia Holt, is a 88 y.o. female, DOB - [birth year], DGL:[id number]  Outpatient Primary MD for the patient is Deland Pretty, MD    LOS - 2  Admit date - 11/27/2021    Chief Complaint  Patient presents with   Shortness of Breath    Productive cough       Brief Narrative (HPI from H&P)   88 y.o. female with medical history significant of glaucoma, macular degeneration, carotid stenosis, anemia, CKD 3, COPD, diabetes, hypertension, hyperlipidemia, prolactinoma, GERD, IBS who presents with ongoing shortness of breath and cough.  In the ER she was diagnosed with acute hypoxic respiratory failure with sats below 85% on room air.  She was diagnosed with pneumonia due to bacterial infection along with COVID-19 infection and admitted to the hospital.  Further stay in the hospital revealed an acute right leg DVT along with possible PE.   Subjective:   Patient in bed, appears comfortable, denies any headache, no fever, no chest pain or pressure, improvement shortness of breath , no abdominal pain. No new focal weakness.   Assessment  & Plan :     Acute Hypoxic Resp. Failure due to bacterial pneumonia along with COVID-19 infection - she has a productive cough along with leukocytosis and elevated procalcitonin, she has already failed outpatient azithromycin 5-day course.  Currently she seems to have severe bacterial pneumonia along with COVID-19 pneumonitis as well.  Continue combination of steroids, Vancomycin and Zosyn.  Has been seen and cleared by speech therapy for aspiration, still has coarse breath sounds and a very weak cough, overall prognosis is still tenuous. DNR, Med Rx, Home hospice in 1-2  days.  Encouraged the patient to sit up in chair in the daytime use I-S and flutter valve for pulmonary toiletry.  Will advance activity and titrate down oxygen as possible.  2.  Acute on chronic anemia.  Recently had hemorrhoidal bleed, she has required outpatient transfusions according to the daughter recently, she is s/p 1 unit packed RBC transfusion on 11/28/2021 with stable posttransfusion H&H, on twice daily PPI, continue  to monitor closely on anticoagulation as in #3 below if hemorrhoidal bleeding gets worse she may require discontinuation of anticoagulation and an IVC filter.  3.  Acute right common femoral vein DVT along with inconclusive CTA chest however with her hypoxia and elevated pulmonary artery pressure likely has PE as well, on heparin drip for 24 hours with stable H&H no signs of ongoing significant rectal bleed with gradually rising hemoglobin, will now switch to Eliquis with caution.  4.  Glaucoma and macular degeneration.  Continue home regimen of eyedrops and methazolamide.  5.  CKD 3A.  Baseline creatinine around 1.3.  Monitor.  6.  Deconditioning and weakness.  Due to advanced age.  At risk for delirium.  Family want.  Minimize narcotics and benzodiazepines.  Use as needed Haldol if she gets confused.  7.  History of CAD.  Chest pain-free.  Echocardiogram noted with preserved EF of 60%, no regional wall motion abnormality but moderate pulmonary artery hypertension along with severe AR, mild AS and moderate MR.  8.  Severe aortic regurgitation.  Could be contributing to some elevation in right-sided pressures, outpatient cardiology follow-up, poor candidate for invasive procedures.         Condition - Extremely Guarded  Family Communication  : Daughter Terri bedside on 11/28/2021, over the phone [phone] on 11/29/2021, bedside 11/30/21  Code Status : DNR  Consults  : None  PUD Prophylaxis : Protonix   Procedures  :     TTE - 1. Left ventricular ejection fraction,  by estimation, is 60 to 65%. The left ventricle has normal function. The left ventricle has no regional wall motion abnormalities.  2. Right ventricular systolic function is normal. The right ventricular size is normal. There is moderately elevated pulmonary artery systolic pressure.  3. Left atrial size was severely dilated.  4. Right atrial size was severely dilated.  5. The mitral valve is abnormal. Moderate mitral valve regurgitation.  6. There is moderate calcification of the aortic valve. There is moderate thickening of the aortic valve. Aortic valve regurgitation is severe. Mild aortic valve stenosis with AVA 1.3cm2, mean gradient 57mmHg, Vmax 2.71m/s, DI 0.4.  7. The inferior vena cava is normal in size with greater than 50% respiratory variability, suggesting right atrial pressure of 3 mmHg. Comparison(s): Compared to prior echo report in 2007, there is now severe AR, mild AS, moderate MR  CTA Chest - There are bilateral pleural effusions layering dependently, larger on the left than the right. The right lung shows mild dependent atelectasis. Some background emphysematous changes in the upper lobes. The left lung shows more considerable atelectasis in the lower lobe, possibly with pre-existing pneumonia. There is also infiltrate and volume loss in the lingula. In this setting, there is diminished opacification of the left lower lobe pulmonary arterial branches. We are very early in the contrast bolus and I favor that the appearance of the left lower lobe pulmonary arterial branches is due to early opacification with mixing of opacified and unopacified blood. Unfortunately, I cannot completely rule out possibility of embolic disease within the left lower lobe. Arguing against this is the absence of any embolus elsewhere within the pulmonary arterial tree. Aortic Atherosclerosis (ICD10-I70.0) and Emphysema (ICD10-J43.9). Coronary artery calcification also present.  Leg [place] - right common femoral vein acute  DVT.      Disposition Plan  :    Status is: Inpt   DVT Prophylaxis  :  Hep gtt    Lab Results  Component Value Date  PLT 417 (H) 11/30/2021    Diet :  Diet Order             Diet Heart Room service appropriate? Yes; Fluid consistency: Thin  Diet effective now                    Inpatient Medications  Scheduled Meds:  vitamin C  500 mg Oral Daily   brimonidine  1 drop Both Eyes BID   cycloSPORINE  1 drop Both Eyes BID   dexamethasone (DECADRON) injection  10 mg Intravenous Q24H   dorzolamide-timolol  1 drop Both Eyes BID   feeding supplement  237 mL Oral BID BM   insulin aspart  0-9 Units Subcutaneous TID WC   Ipratropium-Albuterol  1 puff Inhalation Q6H   melatonin  3 mg Oral QHS   methazolamide  25 mg Oral BID   Netarsudil-Latanoprost  1 drop Both Eyes QHS   pantoprazole  40 mg Oral BID AC   pilocarpine  1 drop Both Eyes TID   sodium chloride flush  3 mL Intravenous Q12H   umeclidinium bromide  1 puff Inhalation Daily   zinc sulfate  220 mg Oral Daily   Continuous Infusions:  sodium chloride Stopped (11/29/21 2300)   heparin 950 Units/hr (11/30/21 0400)   piperacillin-tazobactam (ZOSYN)  IV 3.375 g (11/30/21 0812)   remdesivir 100 mg in NS 100 mL 100 mg (11/29/21 1509)   PRN Meds:.sodium chloride, acetaminophen **OR** acetaminophen, albuterol, chlorpheniramine-HYDROcodone, diclofenac Sodium, guaiFENesin-dextromethorphan, polyethylene glycol, polyvinyl alcohol, traMADol  Time Spent in minutes  30   Lala Lund M.D on 11/30/2021 at 8:49 AM  To page go to [email or web address]   [facility] -  Office  [phone]  See all Orders from today for further details    Objective:   Vitals:   11/29/21 2010 11/30/21 0318 11/30/21 0815 11/30/21 0820  BP: 116/60 (!) 121/55    Pulse: 71 77    Resp: 20 20    Temp: 97.7 F (36.5 C) 97.7 F (36.5 C)    TempSrc: Oral Oral    SpO2: 100% 100% 96% 99%  Weight:  44.1 kg    Height:        Wt Readings  from Last 3 Encounters:  11/30/21 44.1 kg  09/14/21 45.4 kg  08/23/21 44.5 kg     Intake/Output Summary (Last 24 hours) at 11/30/2021 0849 Last data filed at 11/30/2021 0400 Gross per 24 hour  Intake 687.23 ml  Output 950 ml  Net -262.77 ml     Physical Exam  Awake Alert, No new F.N deficits, Normal affect [place].AT,PERRAL Supple Neck, No JVD,   Symmetrical Chest wall movement, coarse bilateral breath sounds, mild rales RRR,No Gallops, Rubs or new Murmurs,  +ve B.Sounds, Abd Soft, No tenderness,   No Cyanosis, Clubbing or edema      Data Review:    CBC Recent Labs  Lab 11/27/21 1731 11/28/21 0201 11/28/21 1649 11/29/21 0032 11/30/21 0441  WBC 17.2* 16.0* 14.2* 15.3* 17.9*  HGB 8.4* 7.5* 9.0* 8.6* 9.2*  HCT 27.3* 24.5* 29.6* 26.5* 28.4*  PLT 375 328 351 344 417*  MCV 103.0* 102.9* 97.7 94.0 93.1  MCH 31.7 31.5 29.7 30.5 30.2  MCHC 30.8 30.6 30.4 32.5 32.4  RDW 16.0* 16.3* 18.5* 18.9* 18.5*  LYMPHSABS 0.7 0.3*  --  0.6* 0.8  MONOABS 1.4* 0.5  --  1.1* 1.0  EOSABS 0.2 0.0  --  0.0 0.0  BASOSABS 0.0 0.0  --  0.0 0.0    Electrolytes Recent Labs  Lab 11/27/21 1731 11/27/21 1919 11/27/21 1949 11/28/21 0201 11/28/21 0911 11/29/21 0032 11/30/21 0441  NA 135  --   --  135  --  130* 134*  K 4.0  --   --  4.3  --  3.6 3.9  CL 102  --   --  103  --  100 101  CO2 20*  --   --  20*  --  18* 23  GLUCOSE 148*  --   --  139*  --  330* 116*  BUN 50*  --   --  44*  --  46* 43*  CREATININE 1.05*  --   --  0.91  --  1.01* 0.88  CALCIUM 9.1  --   --  8.4*  --  8.2* 8.3*  AST 20  --   --  18  --  28 19  ALT 9  --   --  8  --  12 8  ALKPHOS 28*  --   --  34*  --  27* 23*  BILITOT 0.8  --   --  0.7  --  0.4 0.4  ALBUMIN 2.0*  --   --  1.7*  --  1.5* 1.7*  MG  --   --   --  1.9  --  1.9 3.0*  CRP  --   --  24.4* 24.8*  --  20.6* 10.3*  DDIMER  --   --  3.35* 3.65*  --  3.15* 2.01*  PROCALCITON  --   --  2.40  --   --  6.30  --   LATICACIDVEN 1.6 1.0  --   --   --   --   --    INR 1.1  --   --   --   --   --   --   BNP  --   --   --   --  675.0* 942.3* 931.4*    ------------------------------------------------------------------------------------------------------------------ Recent Labs    11/27/21 1949  TRIG 55    No results found for: HGBA1C  No results for input(s): TSH, T4TOTAL, T3FREE, THYROIDAB in the last 72 hours.  Invalid input(s): FREET3 ------------------------------------------------------------------------------------------------------------------ ID Labs Recent Labs  Lab 11/27/21 1731 11/27/21 1919 11/27/21 1949 11/28/21 0201 11/28/21 1649 11/29/21 0032 11/30/21 0441  WBC 17.2*  --   --  16.0* 14.2* 15.3* 17.9*  PLT 375  --   --  328 351 344 417*  CRP  --   --  24.4* 24.8*  --  20.6* 10.3*  DDIMER  --   --  3.35* 3.65*  --  3.15* 2.01*  PROCALCITON  --   --  2.40  --   --  6.30  --   LATICACIDVEN 1.6 1.0  --   --   --   --   --   CREATININE 1.05*  --   --  0.91  --  1.01* 0.88   Cardiac Enzymes No results for input(s): CKMB, TROPONINI, MYOGLOBIN in the last 168 hours.  Invalid input(s): CK   Radiology Reports CT Angio Chest Pulmonary Embolism (PE) W or WO Contrast  Result Date: 11/28/2021 CLINICAL DATA:  Pulmonary embolism suspected, high probability. Shortness of breath and cough. Pneumonia. EXAM: CT ANGIOGRAPHY CHEST WITH CONTRAST TECHNIQUE: Multidetector CT imaging of the chest was performed using the standard protocol during bolus administration of intravenous contrast. Multiplanar CT image reconstructions and MIPs were obtained to evaluate  the vascular anatomy. CONTRAST:  44mL OMNIPAQUE IOHEXOL 350 MG/ML SOLN COMPARISON:  Chest radiography same day. FINDINGS: Cardiovascular: Mild cardiomegaly. No pericardial fluid. Coronary artery calcification is present. Aortic atherosclerotic calcification is present. Pulmonary arterial opacification is good, though somewhat early. No pulmonary emboli are seen on the right. There is  diminished opacification within the left lower lobe pulmonary arterial tree. One could argue that this is slow flow because of infiltrate and collapse in the left lower lobe, but there is suggestion of a central filling defect raising the possibility of embolic disease to the left lower lobe. Unfortunately, I cannot state that with absolute certainty. Mediastinum/Nodes: No mass or lymphadenopathy. Lungs/Pleura: Bilateral layering effusions, larger on the left than the right. Mild emphysematous changes in the upper lobes. Mild dependent atelectasis in the right lung. There is a Bochdalek's hernia on the right. On the left, there is more extensive volume loss and consolidation in the left lower lobe in there may be actual infectious pneumonia. Some volume loss and infiltrate seen in the lingula as well. Upper Abdomen: No acute finding.  Left renal cyst. Musculoskeletal: Ordinary mild spinal degenerative changes. Review of the MIP images confirms the above findings. IMPRESSION: There are bilateral pleural effusions layering dependently, larger on the left than the right. The right lung shows mild dependent atelectasis. Some background emphysematous changes in the upper lobes. The left lung shows more considerable atelectasis in the lower lobe, possibly with pre-existing pneumonia. There is also infiltrate and volume loss in the lingula. In this setting, there is diminished opacification of the left lower lobe pulmonary arterial branches. We are very early in the contrast bolus and I favor that the appearance of the left lower lobe pulmonary arterial branches is due to early opacification with mixing of opacified and unopacified blood. Unfortunately, I cannot completely rule out possibility of embolic disease within the left lower lobe. Arguing against this is the absence of any embolus elsewhere within the pulmonary arterial tree. Aortic Atherosclerosis (ICD10-I70.0) and Emphysema (ICD10-J43.9). Coronary artery  calcification also present. Electronically Signed   By: Nelson Chimes M.D.   On: 11/28/2021 12:03   DG Chest Port 1 View  Result Date: 11/28/2021 CLINICAL DATA:  Shortness of breath EXAM: PORTABLE CHEST 1 VIEW COMPARISON:  11/27/2021 FINDINGS: 0643 hours. Lungs are hyperexpanded. The cardio pericardial silhouette is enlarged. Interstitial markings are diffusely coarsened with chronic features. Persistent left base collapse/consolidation with small left pleural effusion, similar to prior. No focal airspace disease in the left lung. Bones are diffusely demineralized. Telemetry leads overlie the chest. IMPRESSION: 1. Stable exam. 2. Left base collapse/consolidation with small left pleural effusion. Electronically Signed   By: Misty Stanley M.D.   On: 11/28/2021 07:26   DG Chest Port 1 View  Result Date: 11/27/2021 CLINICAL DATA:  Sepsis, short of breath, cough EXAM: PORTABLE CHEST 1 VIEW COMPARISON:  02/01/2021 FINDINGS: Single frontal view of the chest demonstrates dense left basilar consolidation, with small left pleural effusion. Background emphysema. No pneumothorax. Cardiac silhouette is stable. No acute bony abnormalities. IMPRESSION: 1. Left lower lobe pneumonia, with small parapneumonic effusion. Electronically Signed   By: Randa Ngo M.D.   On: 11/27/2021 18:17   VAS [place] LOWER EXTREMITY VENOUS (DVT)  Result Date: 11/28/2021  Lower Venous DVT Study Patient Name:  AMBERLIN UTKE  Date of Exam:   11/28/2021 Medical Rec #: [id number]        Accession #:    [id number] Date of Birth: [birth year]  Patient Gender: F Patient Age:   69 years Exam Location:  [facility] Procedure:      VAS [place] LOWER EXTREMITY VENOUS (DVT) Referring Phys: Deno Etienne [facility] --------------------------------------------------------------------------------  Indications: Elevated Ddimer.  Risk Factors: COVID 19 positive. Limitations: Body habitus and poor ultrasound/tissue interface. Comparison Study: No prior studies.  Performing Technologist: Oliver Hum RVT  Examination Guidelines: A complete evaluation includes B-mode imaging, spectral Doppler, color Doppler, and power Doppler as needed of all accessible portions of each vessel. Bilateral testing is considered an integral part of a complete examination. Limited examinations for reoccurring indications may be performed as noted. The reflux portion of the exam is performed with the patient in reverse Trendelenburg.  +---------+---------------+---------+-----------+----------+--------------+  RIGHT     Compressibility Phasicity Spontaneity Properties Thrombus Aging  +---------+---------------+---------+-----------+----------+--------------+  CFV       Partial         Yes       Yes                    Acute           +---------+---------------+---------+-----------+----------+--------------+  SFJ       Full                                                             +---------+---------------+---------+-----------+----------+--------------+  FV Prox   Full                                                             +---------+---------------+---------+-----------+----------+--------------+  FV Mid    Full                                                             +---------+---------------+---------+-----------+----------+--------------+  FV Distal Full                                                             +---------+---------------+---------+-----------+----------+--------------+  PFV       Full                                                             +---------+---------------+---------+-----------+----------+--------------+  POP       Full            Yes       Yes                                    +---------+---------------+---------+-----------+----------+--------------+  PTV  Full                                                             +---------+---------------+---------+-----------+----------+--------------+  PERO      Full                                                              +---------+---------------+---------+-----------+----------+--------------+ Thrombus located in the common femoral vein is noted to be in the distal segment.  +---------+---------------+---------+-----------+----------+--------------+  LEFT      Compressibility Phasicity Spontaneity Properties Thrombus Aging  +---------+---------------+---------+-----------+----------+--------------+  CFV       Full            Yes       Yes                                    +---------+---------------+---------+-----------+----------+--------------+  SFJ       Full                                                             +---------+---------------+---------+-----------+----------+--------------+  FV Prox   Full                                                             +---------+---------------+---------+-----------+----------+--------------+  FV Mid    Full                                                             +---------+---------------+---------+-----------+----------+--------------+  FV Distal Full                                                             +---------+---------------+---------+-----------+----------+--------------+  PFV       Full                                                             +---------+---------------+---------+-----------+----------+--------------+  POP       Full            Yes       Yes                                    +---------+---------------+---------+-----------+----------+--------------+  PTV       Full                                                             +---------+---------------+---------+-----------+----------+--------------+  PERO      Full                                                             +---------+---------------+---------+-----------+----------+--------------+     Summary: RIGHT: - Findings consistent with acute deep vein thrombosis involving the right common femoral vein. - No cystic structure found in the popliteal  fossa.  LEFT: - There is no evidence of deep vein thrombosis in the lower extremity. However, portions of this examination were limited- see technologist comments above.  - No cystic structure found in the popliteal fossa.  *See table(s) above for measurements and observations. Electronically signed by Harold Barban MD on 11/28/2021 at 11:29:25 PM.    Final    ECHOCARDIOGRAM LIMITED  Result Date: 11/28/2021    ECHOCARDIOGRAM LIMITED REPORT   Patient Name:   PAISYN GUERCIO Date of Exam: 11/28/2021 Medical Rec #:  [id number]       Height:       63.0 in Accession #:    [id number]      Weight:       95.0 lb Date of Birth:  12/18/1921       BSA:          1.409 m Patient Age:    74 years        BP:           119/50 mmHg Patient Gender: F               HR:           91 bpm. Exam Location:  Inpatient Procedure: Limited Echo, Limited Color Doppler and Cardiac Doppler Indications:    acute diastolic chf  History:        Patient has prior history of Echocardiogram examinations, most                 recent 06/04/2006. COPD and Covid. chronic kidney disease,                 Arrythmias:Atrial Fibrillation; Risk Factors:Diabetes,                 Dyslipidemia and Hypertension.  Sonographer:    Johny Chess RDCS Referring Phys: [place]  1. Left ventricular ejection fraction, by estimation, is 60 to 65%. The left ventricle has normal function. The left ventricle has no regional wall motion abnormalities.  2. Right ventricular systolic function is normal. The right ventricular size is normal. There is moderately elevated pulmonary artery systolic pressure.  3. Left atrial size was severely dilated.  4. Right atrial size was severely dilated.  5. The mitral valve is abnormal. Moderate mitral valve regurgitation.  6. There is moderate calcification of the aortic valve. There is moderate thickening of the aortic valve. Aortic valve regurgitation is severe. Mild aortic valve stenosis with AVA 1.3cm2, mean  gradient 61mmHg,  Vmax 2.10m/s, DI 0.4.  7. The inferior vena cava is normal in size with greater than 50% respiratory variability, suggesting right atrial pressure of 3 mmHg. Comparison(s): Compared to prior echo report in 2007, there is now severe AR, mild AS, moderate MR (previously no significant valve disease). FINDINGS  Left Ventricle: Left ventricular ejection fraction, by estimation, is 60 to 65%. The left ventricle has normal function. The left ventricle has no regional wall motion abnormalities. The left ventricular internal cavity size was normal in size. There is  no left ventricular hypertrophy. Right Ventricle: The right ventricular size is normal. Right ventricular systolic function is normal. There is moderately elevated pulmonary artery systolic pressure. The tricuspid regurgitant velocity is 3.26 m/s, and with an assumed right atrial pressure of 3 mmHg, the estimated right ventricular systolic pressure is 27.0 mmHg. Left Atrium: Left atrial size was severely dilated. Right Atrium: Right atrial size was severely dilated. Mitral Valve: The mitral valve is abnormal. There is mild thickening of the mitral valve leaflet(s). There is mild calcification of the mitral valve leaflet(s). Mild mitral annular calcification. Moderate mitral valve regurgitation. Tricuspid Valve: The tricuspid valve is normal in structure. Tricuspid valve regurgitation is mild. Aortic Valve: AVA 1.3cm2 by continuity, mean gradient 60mmHg, Vmax 2.36m/s, DI 0.4. There is moderate calcification of the aortic valve. There is moderate thickening of the aortic valve. Aortic valve regurgitation is moderate to severe. Mild aortic stenosis is present. Pulmonic Valve: The pulmonic valve was normal in structure. Pulmonic valve regurgitation is trivial. Aorta: The aortic root and ascending aorta are structurally normal, with no evidence of dilitation. Venous: The inferior vena cava is normal in size with greater than 50% respiratory  variability, suggesting right atrial pressure of 3 mmHg. LEFT VENTRICLE PLAX 2D LVIDd:         3.70 cm LVIDs:         2.60 cm LV PW:         0.80 cm LV IVS:        0.70 cm LVOT diam:     1.50 cm LVOT Area:     1.77 cm  IVC IVC diam: 1.60 cm LEFT ATRIUM         Index LA diam:    4.10 cm 2.91 cm/m   AORTA Ao Root diam: 2.80 cm Ao Asc diam:  2.60 cm TRICUSPID VALVE TR Peak grad:   42.5 mmHg TR Vmax:        326.00 cm/s  SHUNTS Systemic Diam: 1.50 cm Gwyndolyn Kaufman MD Electronically signed by Gwyndolyn Kaufman MD Signature Date/Time: 11/28/2021/6:00:53 PM    Final

## 2021-12-01 DIAGNOSIS — Z515 Encounter for palliative care: Secondary | ICD-10-CM | POA: Diagnosis not present

## 2021-12-01 DIAGNOSIS — J9601 Acute respiratory failure with hypoxia: Secondary | ICD-10-CM | POA: Diagnosis not present

## 2021-12-01 DIAGNOSIS — R0603 Acute respiratory distress: Secondary | ICD-10-CM

## 2021-12-01 LAB — CBC WITH DIFFERENTIAL/PLATELET
Abs Immature Granulocytes: 0.4 10*3/uL — ABNORMAL HIGH (ref 0.00–0.07)
Basophils Absolute: 0 10*3/uL (ref 0.0–0.1)
Basophils Relative: 0 %
Eosinophils Absolute: 0 10*3/uL (ref 0.0–0.5)
Eosinophils Relative: 0 %
HCT: 28.9 % — ABNORMAL LOW (ref 36.0–46.0)
Hemoglobin: 9.3 g/dL — ABNORMAL LOW (ref 12.0–15.0)
Lymphocytes Relative: 3 %
Lymphs Abs: 0.4 10*3/uL — ABNORMAL LOW (ref 0.7–4.0)
MCH: 30.2 pg (ref 26.0–34.0)
MCHC: 32.2 g/dL (ref 30.0–36.0)
MCV: 93.8 fL (ref 80.0–100.0)
Metamyelocytes Relative: 1 %
Monocytes Absolute: 1.3 10*3/uL — ABNORMAL HIGH (ref 0.1–1.0)
Monocytes Relative: 9 %
Myelocytes: 2 %
Neutro Abs: 12.3 10*3/uL — ABNORMAL HIGH (ref 1.7–7.7)
Neutrophils Relative %: 85 %
Platelets: 450 10*3/uL — ABNORMAL HIGH (ref 150–400)
RBC: 3.08 MIL/uL — ABNORMAL LOW (ref 3.87–5.11)
RDW: 17.7 % — ABNORMAL HIGH (ref 11.5–15.5)
WBC: 14.5 10*3/uL — ABNORMAL HIGH (ref 4.0–10.5)
nRBC: 0 % (ref 0.0–0.2)
nRBC: 0 /100 WBC

## 2021-12-01 LAB — BRAIN NATRIURETIC PEPTIDE: B Natriuretic Peptide: 1096.6 pg/mL — ABNORMAL HIGH (ref 0.0–100.0)

## 2021-12-01 LAB — MAGNESIUM: Magnesium: 2.4 mg/dL (ref 1.7–2.4)

## 2021-12-01 LAB — GLUCOSE, CAPILLARY
Glucose-Capillary: 103 mg/dL — ABNORMAL HIGH (ref 70–99)
Glucose-Capillary: 85 mg/dL (ref 70–99)
Glucose-Capillary: 142 mg/dL — ABNORMAL HIGH (ref 70–99)
Glucose-Capillary: 173 mg/dL — ABNORMAL HIGH (ref 70–99)

## 2021-12-01 LAB — COMPREHENSIVE METABOLIC PANEL
ALT: 9 U/L (ref 0–44)
AST: 20 U/L (ref 15–41)
Albumin: 1.7 g/dL — ABNORMAL LOW (ref 3.5–5.0)
Alkaline Phosphatase: 24 U/L — ABNORMAL LOW (ref 38–126)
Anion gap: 10 (ref 5–15)
BUN: 38 mg/dL — ABNORMAL HIGH (ref 8–23)
CO2: 24 mmol/L (ref 22–32)
Calcium: 8 mg/dL — ABNORMAL LOW (ref 8.9–10.3)
Chloride: 101 mmol/L (ref 98–111)
Creatinine, Ser: 1 mg/dL (ref 0.44–1.00)
GFR, Estimated: 51 mL/min — ABNORMAL LOW (ref >60)
Glucose, Bld: 154 mg/dL — ABNORMAL HIGH (ref 70–99)
Potassium: 3.5 mmol/L (ref 3.5–5.1)
Sodium: 135 mmol/L (ref 135–145)
Total Bilirubin: 0.4 mg/dL (ref 0.3–1.2)
Total Protein: 4.9 g/dL — ABNORMAL LOW (ref 6.5–8.1)

## 2021-12-01 LAB — PROCALCITONIN: Procalcitonin: 1.48 ng/mL

## 2021-12-01 LAB — D-DIMER, QUANTITATIVE: D-Dimer, Quant: 2.11 ug/mL-FEU — ABNORMAL HIGH (ref 0.00–0.50)

## 2021-12-01 LAB — C-REACTIVE PROTEIN: CRP: 6.7 mg/dL — ABNORMAL HIGH (ref <1.0)

## 2021-12-01 LAB — HEPARIN LEVEL (UNFRACTIONATED): Heparin Unfractionated: >1.1 IU/mL — ABNORMAL HIGH (ref 0.30–0.70)

## 2021-12-01 MED ORDER — FUROSEMIDE 10 MG/ML IJ SOLN
60.0000 mg | Freq: Once | INTRAMUSCULAR | Status: AC
  Administered 2021-12-01: 60 mg via INTRAVENOUS
  Filled 2021-12-01: qty 6

## 2021-12-01 MED ORDER — POTASSIUM CHLORIDE CRYS ER 20 MEQ PO TBCR
40.0000 meq | EXTENDED_RELEASE_TABLET | Freq: Once | ORAL | Status: AC
  Administered 2021-12-01: 40 meq via ORAL
  Filled 2021-12-01: qty 2

## 2021-12-01 MED ORDER — SPIRONOLACTONE 25 MG PO TABS
25.0000 mg | ORAL_TABLET | Freq: Every day | ORAL | Status: DC
  Administered 2021-12-01 – 2021-12-02 (×2): 25 mg via ORAL
  Filled 2021-12-01 (×2): qty 1

## 2021-12-01 MED ORDER — LOPERAMIDE HCL 2 MG PO CAPS
2.0000 mg | ORAL_CAPSULE | Freq: Four times a day (QID) | ORAL | Status: DC | PRN
  Administered 2021-12-02: 2 mg via ORAL
  Filled 2021-12-01: qty 1

## 2021-12-01 MED ORDER — DEXAMETHASONE SODIUM PHOSPHATE 10 MG/ML IJ SOLN
4.0000 mg | INTRAMUSCULAR | Status: DC
  Administered 2021-12-01 – 2021-12-02 (×2): 4 mg via INTRAVENOUS
  Filled 2021-12-01 (×2): qty 1

## 2021-12-01 MED ORDER — LOPERAMIDE HCL 2 MG PO CAPS
4.0000 mg | ORAL_CAPSULE | Freq: Once | ORAL | Status: AC
  Administered 2021-12-01: 4 mg via ORAL
  Filled 2021-12-01: qty 2

## 2021-12-01 NOTE — Progress Notes (Signed)
° °  Palliative Medicine Inpatient Follow Up Note   Consulting Provider: MD Candiss Norse   Reason for consult:   [place] Palliative Medicine Consult  Reason for Consult? GOC    HPI:  Per intake H&P --> 88 y.o. female with medical history significant of glaucoma, macular degeneration, carotid stenosis, anemia, CKD 3, COPD, diabetes, hypertension, hyperlipidemia, prolactinoma, GERD, IBS who presents with ongoing shortness of breath and cough.  In the ER she was diagnosed with acute hypoxic respiratory failure with sats below 85% on room air.  She was diagnosed with pneumonia due to bacterial infection along with COVID-19 infection and admitted to the hospital.   Palliative care has been asked to get involved to discuss goals of care in the setting of decreased functional state and failure to thrive.   Today's Discussion (12/01/2021):  *Please note that this is a verbal dictation therefore any spelling or grammatical errors are due to the "[employer]" system interpretation.  Chart reviewed inclusive of progress notes, laboratory results, and diagnostic images.   Per PRN medication review, Shelia Holt has had fentanyl x2 in the last 24 hours.   I met with Shelia Holt at bedside.  She endorses feeling less short of breath than she was yesterday.  She shares with me that she has no pain or nausea today.  Reviewed the plan for discharge to home with hospice care when she is the most optimized that she can be.  Called patients daughter, [name] to provide an update though she did not answer.  Questions and concerns addressed   Palliative Support Provided.   Objective Assessment: Vital Signs Vitals:   12/01/21 0600 12/01/21 0740  BP: (!) 142/67 131/89  Pulse: 88 100  Resp: 20 19  Temp: 97.6 F (36.4 C)   SpO2: 100% 100%    Intake/Output Summary (Last 24 hours) at 12/01/2021 1358 Last data filed at 11/30/2021 2108 Gross per 24 hour  Intake 200 ml  Output 850 ml  Net -650  ml   Last Weight  Most recent update: 12/01/2021  6:04 AM    Weight  44.7 kg (98 lb 8.7 oz)            Gen:  Very frail elderly F in moderate distress HEENT: Dry mucous membranes CV: Regular rate and rhythm  PULM: On 5LPM [place], (+) rhonchi ABD: soft/nontender EXT: No edema Neuro: Alert and oriented x2  SUMMARY OF RECOMMENDATIONS   DNAR/DNI   MOST / DNR Form Completed, paper copy placed onto the chart electric copy can be found in [employer]   Advance Directives scanned into [employer]   [facility] consult   Plan for discharge to home with hospice once optimized  Continue PRN fentanyl for dyspnea   Ongoing incremental Palliative care support  MDM - High in the setting of COVID PNA --> Palliative symptom management with parenteral controlled substances.  ______________________________________________________________________________________ [place] Team Team Cell Phone: [phone] Please utilize secure chat with additional questions, if there is no response within 30 minutes please call the above phone number  Palliative Medicine Team providers are available by phone from 7am to 7pm daily and can be reached through the team cell phone.  Should this patient require assistance outside of these hours, please call the patient's attending physician.

## 2021-12-01 NOTE — Progress Notes (Signed)
PROGRESS NOTE                                                                                                                                                                                                             Patient Demographics:    Shelia Holt, is a 88 y.o. female, DOB - 1922-08-02, JGG:[id number]  Outpatient Primary MD for the patient is Deland Pretty, MD    LOS - 3  Admit date - 11/27/2021    Chief Complaint  Patient presents with   Shortness of Breath    Productive cough       Brief Narrative (HPI from H&P)   88 y.o. female with medical history significant of glaucoma, macular degeneration, carotid stenosis, anemia, CKD 3, COPD, diabetes, hypertension, hyperlipidemia, prolactinoma, GERD, IBS who presents with ongoing shortness of breath and cough.  In the ER she was diagnosed with acute hypoxic respiratory failure with sats below 85% on room air.  She was diagnosed with pneumonia due to bacterial infection along with COVID-19 infection and admitted to the hospital.  Further stay in the hospital revealed an acute right leg DVT along with possible PE.   Subjective:   Patient in bed, appears comfortable, denies any headache, no fever, no chest pain or pressure, +ve cough & shortness of breath , no abdominal pain. No new focal weakness.   Assessment  & Plan :     Acute Hypoxic Resp. Failure due to bacterial pneumonia along with COVID-19 infection - she has a productive cough along with leukocytosis and elevated procalcitonin, she has already failed outpatient azithromycin 5-day course.  Currently she seems to have severe bacterial pneumonia along with COVID-19 pneumonitis as well.  Continue combination of steroids, Vancomycin and Zosyn.  Has been seen and cleared by speech therapy for aspiration, still has coarse breath sounds and a very weak cough, overall prognosis is still tenuous. DNR, Med Rx, Home hospice in 1-2  days.  Encouraged the patient to sit up in chair in the daytime use I-S and flutter valve for pulmonary toiletry.  Will advance activity and titrate down oxygen as possible.  2.  Acute on chronic anemia.  Recently had hemorrhoidal bleed, she has required outpatient transfusions according to the daughter recently, she is s/p 1 unit packed RBC transfusion on 11/28/2021 with stable posttransfusion H&H, on twice daily  PPI, continue to monitor closely on anticoagulation as in #3 below if hemorrhoidal bleeding gets worse she may require discontinuation of anticoagulation and an IVC filter.  3.  Acute right common femoral vein DVT along with inconclusive CTA chest however with her hypoxia and elevated pulmonary artery pressure likely has PE as well, on heparin drip for 24 hours with stable H&H no signs of ongoing significant rectal bleed with gradually rising hemoglobin, will now switch to Eliquis with caution.  4.  Glaucoma and macular degeneration.  Continue home regimen of eyedrops and methazolamide.  5.  CKD 3A.  Baseline creatinine around 1.3.  Monitor.  6.  Deconditioning and weakness.  Due to advanced age.  At risk for delirium.  Family want.  Minimize narcotics and benzodiazepines.  Use as needed Haldol if she gets confused.  7.  History of CAD.  Chest pain-free.  Echocardiogram noted with preserved EF of 60%, no regional wall motion abnormality but moderate pulmonary artery hypertension along with severe AR, mild AS and moderate MR.  8.  Severe aortic regurgitation.  Could be contributing to some elevation in right-sided pressures, outpatient cardiology follow-up, poor candidate for invasive procedures.  9.  Acute on chronic diastolic CHF EF 68%.  Lasix as needed continue, IV Lasix repeated on 12/01/2021 Will monitor.      Condition - Extremely Guarded  Family Communication  : Daughter Terri bedside on 11/28/2021, over the phone [phone] on 11/29/2021, bedside 11/30/21 06/13/2022  Code Status :  DNR  Consults  : None  PUD Prophylaxis : Protonix   Procedures  :     TTE - 1. Left ventricular ejection fraction, by estimation, is 60 to 65%. The left ventricle has normal function. The left ventricle has no regional wall motion abnormalities.  2. Right ventricular systolic function is normal. The right ventricular size is normal. There is moderately elevated pulmonary artery systolic pressure.  3. Left atrial size was severely dilated.  4. Right atrial size was severely dilated.  5. The mitral valve is abnormal. Moderate mitral valve regurgitation.  6. There is moderate calcification of the aortic valve. There is moderate thickening of the aortic valve. Aortic valve regurgitation is severe. Mild aortic valve stenosis with AVA 1.3cm2, mean gradient 32mmHg, Vmax 2.87m/s, DI 0.4.  7. The inferior vena cava is normal in size with greater than 50% respiratory variability, suggesting right atrial pressure of 3 mmHg. Comparison(s): Compared to prior echo report in 2007, there is now severe AR, mild AS, moderate MR  CTA Chest - There are bilateral pleural effusions layering dependently, larger on the left than the right. The right lung shows mild dependent atelectasis. Some background emphysematous changes in the upper lobes. The left lung shows more considerable atelectasis in the lower lobe, possibly with pre-existing pneumonia. There is also infiltrate and volume loss in the lingula. In this setting, there is diminished opacification of the left lower lobe pulmonary arterial branches. We are very early in the contrast bolus and I favor that the appearance of the left lower lobe pulmonary arterial branches is due to early opacification with mixing of opacified and unopacified blood. Unfortunately, I cannot completely rule out possibility of embolic disease within the left lower lobe. Arguing against this is the absence of any embolus elsewhere within the pulmonary arterial tree. Aortic Atherosclerosis  (ICD10-I70.0) and Emphysema (ICD10-J43.9). Coronary artery calcification also present.  Leg [place] - right common femoral vein acute DVT.      Disposition Plan  :  Status is: Inpt   DVT Prophylaxis  :  Hep gtt apixaban (ELIQUIS) tablet 10 mg  apixaban (ELIQUIS) tablet 5 mg    Lab Results  Component Value Date   PLT 450 (H) 12/01/2021    Diet :  Diet Order             Diet Heart Room service appropriate? Yes; Fluid consistency: Thin  Diet effective now                    Inpatient Medications  Scheduled Meds:  apixaban  10 mg Oral BID   Followed by   Derrill Memo ON 12/07/2021] apixaban  5 mg Oral BID   vitamin C  500 mg Oral Daily   brimonidine  1 drop Both Eyes BID   cycloSPORINE  1 drop Both Eyes BID   dexamethasone (DECADRON) injection  4 mg Intravenous Q24H   dorzolamide-timolol  1 drop Both Eyes BID   feeding supplement  237 mL Oral BID BM   furosemide  60 mg Intravenous Once   insulin aspart  0-9 Units Subcutaneous TID WC   Ipratropium-Albuterol  1 puff Inhalation Q6H   melatonin  3 mg Oral QHS   methazolamide  25 mg Oral BID   Netarsudil-Latanoprost  1 drop Both Eyes QHS   pantoprazole  40 mg Oral BID AC   pilocarpine  1 drop Both Eyes TID   potassium chloride  40 mEq Oral Once   sodium chloride flush  3 mL Intravenous Q12H   spironolactone  25 mg Oral Daily   umeclidinium bromide  1 puff Inhalation Daily   zinc sulfate  220 mg Oral Daily   Continuous Infusions:  sodium chloride Stopped (11/29/21 2300)   piperacillin-tazobactam (ZOSYN)  IV 3.375 g (12/01/21 0801)   remdesivir 100 mg in NS 100 mL 100 mg (11/30/21 1712)   PRN Meds:.sodium chloride, acetaminophen **OR** acetaminophen, albuterol, chlorpheniramine-HYDROcodone, diclofenac Sodium, fentaNYL (SUBLIMAZE) injection, guaiFENesin-dextromethorphan, polyethylene glycol, polyvinyl alcohol, traMADol  Time Spent in minutes  30   Lala Lund M.D on 12/01/2021 at 9:21 AM  To page go to [email or web address]    [facility] -  Office  [phone]  See all Orders from today for further details    Objective:   Vitals:   11/30/21 2007 12/01/21 0500 12/01/21 0600 12/01/21 0740  BP:   (!) 142/67 131/89  Pulse:   88 100  Resp:   20 19  Temp:   97.6 F (36.4 C)   TempSrc:   Oral   SpO2: 100%  100% 100%  Weight:  44.7 kg    Height:        Wt Readings from Last 3 Encounters:  12/01/21 44.7 kg  09/14/21 45.4 kg  08/23/21 44.5 kg     Intake/Output Summary (Last 24 hours) at 12/01/2021 0921 Last data filed at 11/30/2021 2108 Gross per 24 hour  Intake 400 ml  Output 850 ml  Net -450 ml     Physical Exam  Awake Alert, No new F.N deficits, Normal affect [place].AT,PERRAL Supple Neck, No JVD,   Symmetrical Chest wall movement, coarse bilateral breath sounds with some crackles at the bases RRR,No Gallops, Rubs or new Murmurs,  +ve B.Sounds, Abd Soft, No tenderness,   No Cyanosis, Clubbing or edema       Data Review:    CBC Recent Labs  Lab 11/27/21 1731 11/28/21 0201 11/28/21 1649 11/29/21 0032 11/30/21 0441 12/01/21 0336  WBC 17.2* 16.0* 14.2* 15.3* 17.9* 14.5*  HGB 8.4* 7.5* 9.0* 8.6* 9.2* 9.3*  HCT 27.3* 24.5* 29.6* 26.5* 28.4* 28.9*  PLT 375 328 351 344 417* 450*  MCV 103.0* 102.9* 97.7 94.0 93.1 93.8  MCH 31.7 31.5 29.7 30.5 30.2 30.2  MCHC 30.8 30.6 30.4 32.5 32.4 32.2  RDW 16.0* 16.3* 18.5* 18.9* 18.5* 17.7*  LYMPHSABS 0.7 0.3*  --  0.6* 0.8 0.4*  MONOABS 1.4* 0.5  --  1.1* 1.0 1.3*  EOSABS 0.2 0.0  --  0.0 0.0 0.0  BASOSABS 0.0 0.0  --  0.0 0.0 0.0    Electrolytes Recent Labs  Lab 11/27/21 1731 11/27/21 1919 11/27/21 1949 11/28/21 0201 11/28/21 0911 11/29/21 0032 11/30/21 0441 12/01/21 0336  NA 135  --   --  135  --  130* 134* 135  K 4.0  --   --  4.3  --  3.6 3.9 3.5  CL 102  --   --  103  --  100 101 101  CO2 20*  --   --  20*  --  18* 23 24  GLUCOSE 148*  --   --  139*  --  330* 116* 154*  BUN 50*  --   --  44*  --  46* 43* 38*   CREATININE 1.05*  --   --  0.91  --  1.01* 0.88 1.00  CALCIUM 9.1  --   --  8.4*  --  8.2* 8.3* 8.0*  AST 20  --   --  18  --  28 19 20   ALT 9  --   --  8  --  12 8 9   ALKPHOS 28*  --   --  34*  --  27* 23* 24*  BILITOT 0.8  --   --  0.7  --  0.4 0.4 0.4  ALBUMIN 2.0*  --   --  1.7*  --  1.5* 1.7* 1.7*  MG  --   --   --  1.9  --  1.9 3.0* 2.4  CRP  --   --  24.4* 24.8*  --  20.6* 10.3* 6.7*  DDIMER  --   --  3.35* 3.65*  --  3.15* 2.01* 2.11*  PROCALCITON  --   --  2.40  --   --  6.30 3.73  --   LATICACIDVEN 1.6 1.0  --   --   --   --   --   --   INR 1.1  --   --   --   --   --   --   --   BNP  --   --   --   --  675.0* 942.3* 931.4* 1,096.6*    ------------------------------------------------------------------------------------------------------------------ No results for input(s): CHOL, HDL, LDLCALC, TRIG, CHOLHDL, LDLDIRECT in the last 72 hours.   No results found for: HGBA1C  No results for input(s): TSH, T4TOTAL, T3FREE, THYROIDAB in the last 72 hours.  Invalid input(s): FREET3 ------------------------------------------------------------------------------------------------------------------ ID Labs Recent Labs  Lab 11/27/21 1731 11/27/21 1919 11/27/21 1949 11/28/21 0201 11/28/21 1649 11/29/21 0032 11/30/21 0441 12/01/21 0336  WBC 17.2*  --   --  16.0* 14.2* 15.3* 17.9* 14.5*  PLT 375  --   --  328 351 344 417* 450*  CRP  --   --  24.4* 24.8*  --  20.6* 10.3* 6.7*  DDIMER  --   --  3.35* 3.65*  --  3.15* 2.01* 2.11*  PROCALCITON  --   --  2.40  --   --  6.30 3.73  --   LATICACIDVEN 1.6 1.0  --   --   --   --   --   --   CREATININE 1.05*  --   --  0.91  --  1.01* 0.88 1.00   Cardiac Enzymes No results for input(s): CKMB, TROPONINI, MYOGLOBIN in the last 168 hours.  Invalid input(s): CK   Radiology Reports CT Angio Chest Pulmonary Embolism (PE) W or WO Contrast  Result Date: 11/28/2021 CLINICAL DATA:  Pulmonary embolism suspected, high probability. Shortness  of breath and cough. Pneumonia. EXAM: CT ANGIOGRAPHY CHEST WITH CONTRAST TECHNIQUE: Multidetector CT imaging of the chest was performed using the standard protocol during bolus administration of intravenous contrast. Multiplanar CT image reconstructions and MIPs were obtained to evaluate the vascular anatomy. CONTRAST:  16mL OMNIPAQUE IOHEXOL 350 MG/ML SOLN COMPARISON:  Chest radiography same day. FINDINGS: Cardiovascular: Mild cardiomegaly. No pericardial fluid. Coronary artery calcification is present. Aortic atherosclerotic calcification is present. Pulmonary arterial opacification is good, though somewhat early. No pulmonary emboli are seen on the right. There is diminished opacification within the left lower lobe pulmonary arterial tree. One could argue that this is slow flow because of infiltrate and collapse in the left lower lobe, but there is suggestion of a central filling defect raising the possibility of embolic disease to the left lower lobe. Unfortunately, I cannot state that with absolute certainty. Mediastinum/Nodes: No mass or lymphadenopathy. Lungs/Pleura: Bilateral layering effusions, larger on the left than the right. Mild emphysematous changes in the upper lobes. Mild dependent atelectasis in the right lung. There is a Bochdalek's hernia on the right. On the left, there is more extensive volume loss and consolidation in the left lower lobe in there may be actual infectious pneumonia. Some volume loss and infiltrate seen in the lingula as well. Upper Abdomen: No acute finding.  Left renal cyst. Musculoskeletal: Ordinary mild spinal degenerative changes. Review of the MIP images confirms the above findings. IMPRESSION: There are bilateral pleural effusions layering dependently, larger on the left than the right. The right lung shows mild dependent atelectasis. Some background emphysematous changes in the upper lobes. The left lung shows more considerable atelectasis in the lower lobe, possibly  with pre-existing pneumonia. There is also infiltrate and volume loss in the lingula. In this setting, there is diminished opacification of the left lower lobe pulmonary arterial branches. We are very early in the contrast bolus and I favor that the appearance of the left lower lobe pulmonary arterial branches is due to early opacification with mixing of opacified and unopacified blood. Unfortunately, I cannot completely rule out possibility of embolic disease within the left lower lobe. Arguing against this is the absence of any embolus elsewhere within the pulmonary arterial tree. Aortic Atherosclerosis (ICD10-I70.0) and Emphysema (ICD10-J43.9). Coronary artery calcification also present. Electronically Signed   By: Nelson Chimes M.D.   On: 11/28/2021 12:03   DG Chest Port 1 View  Result Date: 11/28/2021 CLINICAL DATA:  Shortness of breath EXAM: PORTABLE CHEST 1 VIEW COMPARISON:  11/27/2021 FINDINGS: 0643 hours. Lungs are hyperexpanded. The cardio pericardial silhouette is enlarged. Interstitial markings are diffusely coarsened with chronic features. Persistent left base collapse/consolidation with small left pleural effusion, similar to prior. No focal airspace disease in the left lung. Bones are diffusely demineralized. Telemetry leads overlie the chest. IMPRESSION: 1. Stable exam. 2. Left base collapse/consolidation with small left pleural effusion. Electronically Signed   By: Misty Stanley M.D.   On: 11/28/2021 07:26   DG Chest  Port 1 View  Result Date: 11/27/2021 CLINICAL DATA:  Sepsis, short of breath, cough EXAM: PORTABLE CHEST 1 VIEW COMPARISON:  02/01/2021 FINDINGS: Single frontal view of the chest demonstrates dense left basilar consolidation, with small left pleural effusion. Background emphysema. No pneumothorax. Cardiac silhouette is stable. No acute bony abnormalities. IMPRESSION: 1. Left lower lobe pneumonia, with small parapneumonic effusion. Electronically Signed   By: Randa Ngo M.D.    On: 11/27/2021 18:17   VAS [place] LOWER EXTREMITY VENOUS (DVT)  Result Date: 11/28/2021  Lower Venous DVT Study Patient Name:  LENDORA KEYS  Date of Exam:   11/28/2021 Medical Rec #: [id number]        Accession #:    [id number] Date of Birth: 1922/08/13        Patient Gender: F Patient Age:   89 years Exam Location:  [facility] Procedure:      VAS [place] LOWER EXTREMITY VENOUS (DVT) Referring Phys: Deno Etienne [facility] --------------------------------------------------------------------------------  Indications: Elevated Ddimer.  Risk Factors: COVID 19 positive. Limitations: Body habitus and poor ultrasound/tissue interface. Comparison Study: No prior studies. Performing Technologist: Oliver Hum RVT  Examination Guidelines: A complete evaluation includes B-mode imaging, spectral Doppler, color Doppler, and power Doppler as needed of all accessible portions of each vessel. Bilateral testing is considered an integral part of a complete examination. Limited examinations for reoccurring indications may be performed as noted. The reflux portion of the exam is performed with the patient in reverse Trendelenburg.  +---------+---------------+---------+-----------+----------+--------------+  RIGHT     Compressibility Phasicity Spontaneity Properties Thrombus Aging  +---------+---------------+---------+-----------+----------+--------------+  CFV       Partial         Yes       Yes                    Acute           +---------+---------------+---------+-----------+----------+--------------+  SFJ       Full                                                             +---------+---------------+---------+-----------+----------+--------------+  FV Prox   Full                                                             +---------+---------------+---------+-----------+----------+--------------+  FV Mid    Full                                                              +---------+---------------+---------+-----------+----------+--------------+  FV Distal Full                                                             +---------+---------------+---------+-----------+----------+--------------+  PFV  Full                                                             +---------+---------------+---------+-----------+----------+--------------+  POP       Full            Yes       Yes                                    +---------+---------------+---------+-----------+----------+--------------+  PTV       Full                                                             +---------+---------------+---------+-----------+----------+--------------+  PERO      Full                                                             +---------+---------------+---------+-----------+----------+--------------+ Thrombus located in the common femoral vein is noted to be in the distal segment.  +---------+---------------+---------+-----------+----------+--------------+  LEFT      Compressibility Phasicity Spontaneity Properties Thrombus Aging  +---------+---------------+---------+-----------+----------+--------------+  CFV       Full            Yes       Yes                                    +---------+---------------+---------+-----------+----------+--------------+  SFJ       Full                                                             +---------+---------------+---------+-----------+----------+--------------+  FV Prox   Full                                                             +---------+---------------+---------+-----------+----------+--------------+  FV Mid    Full                                                             +---------+---------------+---------+-----------+----------+--------------+  FV Distal Full                                                             +---------+---------------+---------+-----------+----------+--------------+  PFV       Full                                                              +---------+---------------+---------+-----------+----------+--------------+  POP       Full            Yes       Yes                                    +---------+---------------+---------+-----------+----------+--------------+  PTV       Full                                                             +---------+---------------+---------+-----------+----------+--------------+  PERO      Full                                                             +---------+---------------+---------+-----------+----------+--------------+     Summary: RIGHT: - Findings consistent with acute deep vein thrombosis involving the right common femoral vein. - No cystic structure found in the popliteal fossa.  LEFT: - There is no evidence of deep vein thrombosis in the lower extremity. However, portions of this examination were limited- see technologist comments above.  - No cystic structure found in the popliteal fossa.  *See table(s) above for measurements and observations. Electronically signed by Harold Barban MD on 11/28/2021 at 11:29:25 PM.    Final    ECHOCARDIOGRAM LIMITED  Result Date: 11/28/2021    ECHOCARDIOGRAM LIMITED REPORT   Patient Name:   JENNIFE ZAUCHA Date of Exam: 11/28/2021 Medical Rec #:  [id number]       Height:       63.0 in Accession #:    [id number]      Weight:       95.0 lb Date of Birth:  1922/03/31       BSA:          1.409 m Patient Age:    61 years        BP:           119/50 mmHg Patient Gender: F               HR:           91 bpm. Exam Location:  Inpatient Procedure: Limited Echo, Limited Color Doppler and Cardiac Doppler Indications:    acute diastolic chf  History:        Patient has prior history of Echocardiogram examinations, most                 recent 06/04/2006. COPD and Covid. chronic kidney disease,                 Arrythmias:Atrial Fibrillation; Risk Factors:Diabetes,  Dyslipidemia and Hypertension.  Sonographer:    Johny Chess RDCS  Referring Phys: [place]  1. Left ventricular ejection fraction, by estimation, is 60 to 65%. The left ventricle has normal function. The left ventricle has no regional wall motion abnormalities.  2. Right ventricular systolic function is normal. The right ventricular size is normal. There is moderately elevated pulmonary artery systolic pressure.  3. Left atrial size was severely dilated.  4. Right atrial size was severely dilated.  5. The mitral valve is abnormal. Moderate mitral valve regurgitation.  6. There is moderate calcification of the aortic valve. There is moderate thickening of the aortic valve. Aortic valve regurgitation is severe. Mild aortic valve stenosis with AVA 1.3cm2, mean gradient 38mmHg, Vmax 2.37m/s, DI 0.4.  7. The inferior vena cava is normal in size with greater than 50% respiratory variability, suggesting right atrial pressure of 3 mmHg. Comparison(s): Compared to prior echo report in 2007, there is now severe AR, mild AS, moderate MR (previously no significant valve disease). FINDINGS  Left Ventricle: Left ventricular ejection fraction, by estimation, is 60 to 65%. The left ventricle has normal function. The left ventricle has no regional wall motion abnormalities. The left ventricular internal cavity size was normal in size. There is  no left ventricular hypertrophy. Right Ventricle: The right ventricular size is normal. Right ventricular systolic function is normal. There is moderately elevated pulmonary artery systolic pressure. The tricuspid regurgitant velocity is 3.26 m/s, and with an assumed right atrial pressure of 3 mmHg, the estimated right ventricular systolic pressure is 54.2 mmHg. Left Atrium: Left atrial size was severely dilated. Right Atrium: Right atrial size was severely dilated. Mitral Valve: The mitral valve is abnormal. There is mild thickening of the mitral valve leaflet(s). There is mild calcification of the mitral valve leaflet(s). Mild  mitral annular calcification. Moderate mitral valve regurgitation. Tricuspid Valve: The tricuspid valve is normal in structure. Tricuspid valve regurgitation is mild. Aortic Valve: AVA 1.3cm2 by continuity, mean gradient 74mmHg, Vmax 2.29m/s, DI 0.4. There is moderate calcification of the aortic valve. There is moderate thickening of the aortic valve. Aortic valve regurgitation is moderate to severe. Mild aortic stenosis is present. Pulmonic Valve: The pulmonic valve was normal in structure. Pulmonic valve regurgitation is trivial. Aorta: The aortic root and ascending aorta are structurally normal, with no evidence of dilitation. Venous: The inferior vena cava is normal in size with greater than 50% respiratory variability, suggesting right atrial pressure of 3 mmHg. LEFT VENTRICLE PLAX 2D LVIDd:         3.70 cm LVIDs:         2.60 cm LV PW:         0.80 cm LV IVS:        0.70 cm LVOT diam:     1.50 cm LVOT Area:     1.77 cm  IVC IVC diam: 1.60 cm LEFT ATRIUM         Index LA diam:    4.10 cm 2.91 cm/m   AORTA Ao Root diam: 2.80 cm Ao Asc diam:  2.60 cm TRICUSPID VALVE TR Peak grad:   42.5 mmHg TR Vmax:        326.00 cm/s  SHUNTS Systemic Diam: 1.50 cm Gwyndolyn Kaufman MD Electronically signed by Gwyndolyn Kaufman MD Signature Date/Time: 11/28/2021/6:00:53 PM    Final

## 2021-12-01 NOTE — TOC Progression Note (Signed)
Transition of Care [facility]) - Progression Note    Patient Details  Name: JENILEE FRANEY MRN: [id number] Date of Birth: 1922-06-07  [facility] CM/SW Contact  Carles Collet, RN Phone Number: 12/01/2021, 11:55 AM  Clinical Narrative:   Damaris Schooner w patient's daughter [name]. She states that per her conversation w MD this morning it will be a couple more days before DC. DME has not been delivered to the home, and will be closer to DC by [facility]. TOC will need to notify [facility] preferably day prior to DC when date is known.   Johnson,Terri Daughter [phone]      Expected Discharge Plan: Home w Hospice Care Barriers to Discharge: Continued Medical Work up  Expected Discharge Plan and Services Expected Discharge Plan: [place]   Discharge Planning Services: CM Consult   Living arrangements for the past 2 months: Single Family Home                   DME Agency: NA       HH Arranged: RN [place] Agency:  Teacher, early years/pre) Date HH Agency Contacted: 11/30/21 Time [place]: [phone] Representative spoke with at [place]: [place] ([place]) Interventions    Readmission Risk Interventions No flowsheet data found.

## 2021-12-01 NOTE — Progress Notes (Signed)
Manufacturing engineer [facility])  Noted medical optimization still ongoing and DME will need to be ordered closer to d/c date.  [facility] will continue to follow and arrange DME once dc date is determined.  Venia Carbon RN, BSN [facility] Liaison

## 2021-12-01 NOTE — Progress Notes (Signed)
Patient still having bloody stool MD notified, imodium given per order. Will continue to monitor.

## 2021-12-02 ENCOUNTER — Inpatient Hospital Stay (HOSPITAL_COMMUNITY): Payer: Medicare Other

## 2021-12-02 DIAGNOSIS — J9601 Acute respiratory failure with hypoxia: Secondary | ICD-10-CM | POA: Diagnosis not present

## 2021-12-02 LAB — COMPREHENSIVE METABOLIC PANEL
ALT: 7 U/L (ref 0–44)
AST: 23 U/L (ref 15–41)
Albumin: 1.8 g/dL — ABNORMAL LOW (ref 3.5–5.0)
Alkaline Phosphatase: 21 U/L — ABNORMAL LOW (ref 38–126)
Anion gap: 14 (ref 5–15)
BUN: 38 mg/dL — ABNORMAL HIGH (ref 8–23)
CO2: 21 mmol/L — ABNORMAL LOW (ref 22–32)
Calcium: 8.3 mg/dL — ABNORMAL LOW (ref 8.9–10.3)
Chloride: 101 mmol/L (ref 98–111)
Creatinine, Ser: 1.31 mg/dL — ABNORMAL HIGH (ref 0.44–1.00)
GFR, Estimated: 37 mL/min — ABNORMAL LOW (ref >60)
Glucose, Bld: 218 mg/dL — ABNORMAL HIGH (ref 70–99)
Potassium: 5.4 mmol/L — ABNORMAL HIGH (ref 3.5–5.1)
Sodium: 136 mmol/L (ref 135–145)
Total Bilirubin: 0.5 mg/dL (ref 0.3–1.2)
Total Protein: 4.7 g/dL — ABNORMAL LOW (ref 6.5–8.1)

## 2021-12-02 LAB — CBC WITH DIFFERENTIAL/PLATELET
Abs Immature Granulocytes: 2.28 10*3/uL — ABNORMAL HIGH (ref 0.00–0.07)
Basophils Absolute: 0.1 10*3/uL (ref 0.0–0.1)
Basophils Relative: 0 %
Eosinophils Absolute: 0.1 10*3/uL (ref 0.0–0.5)
Eosinophils Relative: 0 %
HCT: 26.3 % — ABNORMAL LOW (ref 36.0–46.0)
Hemoglobin: 8.3 g/dL — ABNORMAL LOW (ref 12.0–15.0)
Immature Granulocytes: 9 %
Lymphocytes Relative: 6 %
Lymphs Abs: 1.4 10*3/uL (ref 0.7–4.0)
MCH: 30.6 pg (ref 26.0–34.0)
MCHC: 31.6 g/dL (ref 30.0–36.0)
MCV: 97 fL (ref 80.0–100.0)
Monocytes Absolute: 1.8 10*3/uL — ABNORMAL HIGH (ref 0.1–1.0)
Monocytes Relative: 7 %
Neutro Abs: 18.9 10*3/uL — ABNORMAL HIGH (ref 1.7–7.7)
Neutrophils Relative %: 78 %
Platelets: 498 10*3/uL — ABNORMAL HIGH (ref 150–400)
RBC: 2.71 MIL/uL — ABNORMAL LOW (ref 3.87–5.11)
RDW: 17.8 % — ABNORMAL HIGH (ref 11.5–15.5)
WBC: 24.5 10*3/uL — ABNORMAL HIGH (ref 4.0–10.5)
nRBC: 0.2 % (ref 0.0–0.2)

## 2021-12-02 LAB — CULTURE, BLOOD (ROUTINE X 2)
Culture: NO GROWTH
Culture: NO GROWTH
Special Requests: ADEQUATE
Special Requests: ADEQUATE

## 2021-12-02 LAB — GLUCOSE, CAPILLARY: Glucose-Capillary: 140 mg/dL — ABNORMAL HIGH (ref 70–99)

## 2021-12-02 LAB — MAGNESIUM: Magnesium: 2.2 mg/dL (ref 1.7–2.4)

## 2021-12-02 LAB — C-REACTIVE PROTEIN: CRP: 5 mg/dL — ABNORMAL HIGH (ref <1.0)

## 2021-12-02 LAB — BRAIN NATRIURETIC PEPTIDE: B Natriuretic Peptide: 395.8 pg/mL — ABNORMAL HIGH (ref 0.0–100.0)

## 2021-12-02 LAB — PROCALCITONIN: Procalcitonin: 0.58 ng/mL

## 2021-12-02 MED ORDER — ONDANSETRON HCL 4 MG PO TABS: 4.0000 mg | ORAL_TABLET | Freq: Three times a day (TID) | ORAL | 0 refills | Status: AC | PRN

## 2021-12-02 MED ORDER — MORPHINE SULFATE (CONCENTRATE) 10 MG/0.5ML PO SOLN: 10.0000 mg | ORAL | 0 refills | Status: AC | PRN

## 2021-12-02 MED ORDER — MORPHINE SULFATE (PF) 2 MG/ML IV SOLN
1.0000 mg | INTRAVENOUS | Status: DC | PRN
Start: 2021-12-02 — End: 2021-12-02
  Administered 2021-12-02: 1 mg via INTRAVENOUS
  Filled 2021-12-02: qty 1

## 2021-12-02 MED ORDER — ONDANSETRON HCL 4 MG/2ML IJ SOLN
INTRAMUSCULAR | Status: AC
  Administered 2021-12-02: 8 mg
  Filled 2021-12-02: qty 4

## 2021-12-02 MED ORDER — FUROSEMIDE 10 MG/ML IJ SOLN
60.0000 mg | Freq: Once | INTRAMUSCULAR | Status: AC
  Administered 2021-12-02: 60 mg via INTRAVENOUS
  Filled 2021-12-02: qty 6

## 2021-12-02 MED ORDER — ONDANSETRON HCL 4 MG/2ML IJ SOLN: 4.0000 mg | Freq: Four times a day (QID) | INTRAMUSCULAR | Status: DC | PRN

## 2021-12-02 MED ORDER — APIXABAN 5 MG PO TABS: 10.0000 mg | ORAL_TABLET | Freq: Two times a day (BID) | ORAL | 0 refills | Status: AC

## 2021-12-02 MED ORDER — LOPERAMIDE HCL 2 MG PO TABS
2.0000 mg | ORAL_TABLET | Freq: Four times a day (QID) | ORAL | 0 refills | Status: AC | PRN
Start: 2021-12-02 — End: ?

## 2021-12-02 MED ORDER — APIXABAN 5 MG PO TABS: 5.0000 mg | ORAL_TABLET | Freq: Two times a day (BID) | ORAL | 0 refills | Status: AC

## 2021-12-02 MED ORDER — HYDROCOD POLST-CPM POLST ER 10-8 MG/5ML PO SUER: 5.0000 mL | Freq: Two times a day (BID) | ORAL | 0 refills | Status: AC | PRN

## 2021-12-02 MED ORDER — LORAZEPAM 2 MG/ML PO CONC
1.0000 mg | Freq: Four times a day (QID) | ORAL | 0 refills | Status: AC | PRN
Start: 2021-12-02 — End: ?

## 2021-12-02 NOTE — Progress Notes (Incomplete)
Pharmacy Antibiotic Note  Shelia Holt is a 88 y.o. female admitted on 11/27/2021 with pneumonia, failed azithromycin outpatient. Also noted to be COVID-19 positive and s/p Remdesivir x 5 days. Pharmacy consulted to transition from ceftriaxone/azithromycin to vancomycin/zosyn dosing due to patient becoming septic per MD.   De-escalated to zosyn monotherapy with negative MRSA PCR.  Today is D5. No growth on cultures to date. SCr stable and at baseline. Afebrile. WBC trending down.   Plan: Zosyn 3.375g IV q8h (4h infusion) Monitor clinical progress, c/s, renal function F/u de-escalation plan/LOT   Height: 5\' 4"  (162.6 cm) Weight: 44.7 kg (98 lb 8.7 oz) IBW/kg (Calculated) : 54.7  Temp (24hrs), Avg:98 F (36.7 C), Min:98 F (36.7 C), Max:98 F (36.7 C)  Recent Labs  Lab 11/27/21 1731 11/27/21 1919 11/28/21 0201 11/28/21 1649 11/29/21 0032 11/30/21 0441 12/01/21 0336  WBC 17.2*  --  16.0* 14.2* 15.3* 17.9* 14.5*  CREATININE 1.05*  --  0.91  --  1.01* 0.88 1.00  LATICACIDVEN 1.6 1.0  --   --   --   --   --      Estimated Creatinine Clearance: 22.7 mL/min (by C-G formula based on SCr of 1 mg/dL).    Allergies  Allergen Reactions   Celecoxib Palpitations    palpitations   Rofecoxib Palpitations    palpitations palpitations   Gabapentin Other (See Comments)    confusion   Levaquin [Levofloxacin In D5w] Other (See Comments)    aching   Levofloxacin Other (See Comments)    Aching, Muscle weakness Other reaction(s): Other (See Comments) Aching aching Muscle weakness   Nortriptyline Other (See Comments)    Change in Behavior Other reaction(s): Other (See Comments) Change in Behavior   Nortriptyline Hcl Other (See Comments)    Change in behavior Change in behavior    Antimicrobials this admission: 1/4 vancomycin >> 1/5 (x1 dose)  1/4 zosyn >>  1/3 ceftriaxone/azithromycin >> 1/4 1/3 remdesivir >> 1/7   Microbiology results: 1/3 BCx - NGTD 1/3 UC: multiple  spp, suggest recollect   Laurey Arrow, PharmD PGY1 Pharmacy Resident 12/02/2021  7:23 AM  Please check [email or web address] for unit-specific pharmacy phone numbers.

## 2021-12-02 NOTE — Progress Notes (Signed)
Per MD Candiss Norse (at bedside), verbal order to override cabinet to give patient one time dose of 8mg  zofran.

## 2021-12-02 NOTE — Progress Notes (Signed)
Daughter called my attention d/t pt having SOB. Wheezy lung sounds and pt has been coughing. Breathing trt. Given as per mar. And gave PRN meds as well.  12/02/21 0200  Vitals  BP 97/66  MAP (mmHg) 76  Pulse Rate (!) 104  ECG Heart Rate (!) 113  Resp (!) 27  MEWS COLOR  MEWS Score Color Red  Oxygen Therapy  SpO2 100 %  MEWS Score  MEWS Temp 0  MEWS Systolic 1  MEWS Pulse 2  MEWS RR 2  MEWS LOC 0  MEWS Score 5   After meds given. Pt gradually feeling better.

## 2021-12-02 NOTE — Discharge Summary (Signed)
Shelia Holt LKT:[id number] DOB: 26-Aug-1922 DOA: 11/27/2021  PCP: Deland Pretty, MD  Admit date: 11/27/2021  Discharge date: 12/02/2021  Admitted From: Home   Disposition:  Home with Hospice   Recommendations for Outpatient Follow-up:   Follow up with PCP in 1-2 weeks  PCP Please obtain BMP/CBC, 2 view CXR in 1week,  (see Discharge instructions)   PCP Please follow up on the following pending results:    Home Health: Hospice   Equipment/Devices: as below  Consultations: Pall.Care Discharge Condition: Guarded CODE STATUS: DNR   Diet Recommendation: Soft    Chief Complaint  Patient presents with   Shortness of Breath    Productive cough     Brief history of present illness from the day of admission and additional interim summary    88 y.o. female with medical history significant of glaucoma, macular degeneration, carotid stenosis, anemia, CKD 3, COPD, diabetes, hypertension, hyperlipidemia, prolactinoma, GERD, IBS who presents with ongoing shortness of breath and cough.  In the ER she was diagnosed with acute hypoxic respiratory failure with sats below 85% on room air.  She was diagnosed with pneumonia due to bacterial infection along with COVID-19 infection and admitted to the hospital.  Further stay in the hospital revealed an acute right leg DVT along with possible PE.                                                                 Hospital Course    SpO2: 100 % O2 Flow Rate (L/min): 6 L/min  Acute Hypoxic Resp. Failure due to bacterial pneumonia along with COVID-19 infection - she had a productive cough with high procalcitonin and leukocyte count and was also COVID-19 positive, she was treated with empiric broad-spectrum IV antibiotics along with steroids IV without much improvement, she was seen by  speech and cleared in terms of aspiration.  However due to extremely weak cough reflex and her advanced age along with severe underlying deconditioning she was unable to clear her pulmonary secretions and her pneumonia continue to get worse, she is continuing to require 5 to 6 L of oxygen and her breath sounds are worsening despite appropriate care.  Patient and family now wish to be DNR and be discharged home with hospice with comfort measures only, all known comfort medications will be stopped.  Goal of care is comfort.     2.  Acute on chronic anemia.  Recently had hemorrhoidal bleed, she has required outpatient transfusions according to the daughter recently, she is s/p 1 unit packed RBC transfusion on 11/28/2021 with stable posttransfusion H&H, on twice daily PPI, continue to monitor closely on anticoagulation as in #3 below if hemorrhoidal bleeding gets worse she may require discontinuation of anticoagulation and an IVC filter.   3.  Acute right common femoral vein  DVT along with inconclusive CTA chest however with her hypoxia and elevated pulmonary artery pressure likely has PE as well, on heparin drip for 24 hours with stable H&H no signs of ongoing significant rectal bleed with gradually rising hemoglobin, will now switch to Eliquis with caution.  We will continue Eliquis as reduce PE burden might help her shortness of breath and discomfort.  If she declines further hospice nurse can stop this if appropriate.   4.  Glaucoma and macular degeneration.  Continue home regimen of eyedrops and methazolamide.   5.  CKD 3A.  Baseline creatinine around 1.3.  Monitor.   6.  Deconditioning and weakness.  Due to advanced age.  At risk for delirium.  Family want.  Minimize narcotics and benzodiazepines.  Use as needed Haldol if she gets confused.   7.  History of CAD.  Chest pain-free.  Echocardiogram noted with preserved EF of 60%, no regional wall motion abnormality but moderate pulmonary artery  hypertension along with severe AR, mild AS and moderate MR.   8.  Severe aortic regurgitation.  Could be contributing to some elevation in right-sided pressures, outpatient cardiology follow-up, poor candidate for invasive procedures.   9.  Acute on chronic diastolic CHF EF 06%.  Was diuresed appropriately with Lasix and Aldactone, now close to compensated.     Discharge diagnosis     Principal Problem:   Acute respiratory failure with hypoxia (HCC) Active Problems:   Diabetes mellitus type 2, controlled (HCC)   COPD (chronic obstructive pulmonary disease) (HCC)   Anemia, iron deficiency   Chronic kidney disease, stage 3 (HCC)   PNA (pneumonia)   COVID-19 virus infection   Acute on chronic respiratory failure with hypoxia [facility])    Discharge instructions    Discharge Instructions     Discharge instructions   Complete by: As directed    Disposition.  Home hospice Condition.  Guarded CODE STATUS.  DNR Activity.  With assistance as tolerated, full fall precautions. Diet.  Soft with feeding assistance and aspiration precautions. Goal of care.  Comfort.       Discharge Medications   Allergies as of 12/02/2021       Reactions   Celecoxib Palpitations   palpitations   Rofecoxib Palpitations   palpitations palpitations   Gabapentin Other (See Comments)   confusion   Levaquin [levofloxacin In D5w] Other (See Comments)   aching   Levofloxacin Other (See Comments)   Aching, Muscle weakness Other reaction(s): Other (See Comments) Aching aching Muscle weakness   Nortriptyline Other (See Comments)   Change in Behavior Other reaction(s): Other (See Comments) Change in Behavior   Nortriptyline Hcl Other (See Comments)   Change in behavior Change in behavior        Medication List     STOP taking these medications    GERITOL TONIC PO   traMADol 50 MG tablet Commonly known as: ULTRAM   VITAMIN B-12 PO   Vitamin D 50 MCG (2000 UT) Caps       TAKE  these medications    ACIDOPHILUS LACTOBACILLUS PO Take 1 tablet by mouth daily.   albuterol (2.5 MG/3ML) 0.083% nebulizer solution Commonly known as: PROVENTIL Take 2.5 mg by nebulization every 6 (six) hours as needed for wheezing or shortness of breath.   apixaban 5 MG Tabs tablet Commonly known as: ELIQUIS Take 2 tablets (10 mg total) by mouth 2 (two) times daily for 4 days.   apixaban 5 MG Tabs tablet Commonly known as: ELIQUIS  Take 1 tablet (5 mg total) by mouth 2 (two) times daily. Start taking on: December 07, 2021   Artificial Tears 0.1-0.3 % Soln Generic drug: Dextran 70-Hypromellose Place 1 drop into both eyes daily as needed.   brimonidine 0.1 % Soln Commonly known as: ALPHAGAN P Place 1 drop into both eyes 2 (two) times daily. Use 1 drop in both eyes twice a day   chlorpheniramine-HYDROcodone 10-8 MG/5ML Suer Commonly known as: TUSSIONEX Take 5 mLs by mouth every 12 (twelve) hours as needed for cough.   cycloSPORINE 0.05 % ophthalmic emulsion Commonly known as: RESTASIS Place 1 drop into both eyes 2 (two) times daily.   dorzolamide-timolol 22.3-6.8 MG/ML ophthalmic solution Commonly known as: COSOPT Place 1 drop into both eyes 2 (two) times daily.   loperamide 2 MG tablet Commonly known as: Imodium A-D Take 1 tablet (2 mg total) by mouth 4 (four) times daily as needed for diarrhea or loose stools.   LORazepam 2 MG/ML concentrated solution Commonly known as: ATIVAN Take 0.5 mLs (1 mg total) by mouth every 6 (six) hours as needed for anxiety.   meclizine 25 MG tablet Commonly known as: ANTIVERT Take 25 mg by mouth 3 (three) times daily as needed for dizziness.   methazolamide 25 MG tablet Commonly known as: NEPTAZANE Take 25 mg by mouth 2 (two) times daily.   morphine CONCENTRATE 10 MG/0.5ML Soln concentrated solution Take 0.5 mLs (10 mg total) by mouth every 3 (three) hours as needed for moderate pain or severe pain.   Nitrostat 0.4 MG SL  tablet Generic drug: nitroGLYCERIN PLACE 1 TABLET UNDER THE TONGUE EVERY 5 MINUTES UP TO 3 DOSES AS NEEDED FOR CHEST PAIN. What changed: See the new instructions.   ondansetron 4 MG tablet Commonly known as: Zofran Take 1 tablet (4 mg total) by mouth every 8 (eight) hours as needed for nausea or vomiting.   pilocarpine 2 % ophthalmic solution Commonly known as: PILOCAR Place 1 drop into both eyes 3 (three) times daily.   Rocklatan 0.02-0.005 % Soln Generic drug: Netarsudil-Latanoprost Place 1 drop into both eyes at bedtime.   tiotropium 18 MCG inhalation capsule Commonly known as: SPIRIVA Place 18 mcg into inhaler and inhale daily.   zaleplon 5 MG capsule Commonly known as: SONATA Take 5 mg by mouth at bedtime as needed for sleep.               Durable Medical Equipment  (From admission, onward)           Start     Ordered   12/02/21 0848  For home use only DME oxygen  Once       Question Answer Comment  Length of Need 12 Months   Mode or (Route) Nasal cannula   Liters per Minute 5   Frequency Continuous (stationary and portable oxygen unit needed)   Oxygen conserving device Yes   Oxygen delivery system Gas      12/02/21 0848              Major procedures and Radiology Reports - PLEASE review detailed and final reports thoroughly  -       CT Angio Chest Pulmonary Embolism (PE) W or WO Contrast  Result Date: 11/28/2021 CLINICAL DATA:  Pulmonary embolism suspected, high probability. Shortness of breath and cough. Pneumonia. EXAM: CT ANGIOGRAPHY CHEST WITH CONTRAST TECHNIQUE: Multidetector CT imaging of the chest was performed using the standard protocol during bolus administration of intravenous contrast. Multiplanar CT image reconstructions  and MIPs were obtained to evaluate the vascular anatomy. CONTRAST:  41mL OMNIPAQUE IOHEXOL 350 MG/ML SOLN COMPARISON:  Chest radiography same day. FINDINGS: Cardiovascular: Mild cardiomegaly. No pericardial fluid.  Coronary artery calcification is present. Aortic atherosclerotic calcification is present. Pulmonary arterial opacification is good, though somewhat early. No pulmonary emboli are seen on the right. There is diminished opacification within the left lower lobe pulmonary arterial tree. One could argue that this is slow flow because of infiltrate and collapse in the left lower lobe, but there is suggestion of a central filling defect raising the possibility of embolic disease to the left lower lobe. Unfortunately, I cannot state that with absolute certainty. Mediastinum/Nodes: No mass or lymphadenopathy. Lungs/Pleura: Bilateral layering effusions, larger on the left than the right. Mild emphysematous changes in the upper lobes. Mild dependent atelectasis in the right lung. There is a Bochdalek's hernia on the right. On the left, there is more extensive volume loss and consolidation in the left lower lobe in there may be actual infectious pneumonia. Some volume loss and infiltrate seen in the lingula as well. Upper Abdomen: No acute finding.  Left renal cyst. Musculoskeletal: Ordinary mild spinal degenerative changes. Review of the MIP images confirms the above findings. IMPRESSION: There are bilateral pleural effusions layering dependently, larger on the left than the right. The right lung shows mild dependent atelectasis. Some background emphysematous changes in the upper lobes. The left lung shows more considerable atelectasis in the lower lobe, possibly with pre-existing pneumonia. There is also infiltrate and volume loss in the lingula. In this setting, there is diminished opacification of the left lower lobe pulmonary arterial branches. We are very early in the contrast bolus and I favor that the appearance of the left lower lobe pulmonary arterial branches is due to early opacification with mixing of opacified and unopacified blood. Unfortunately, I cannot completely rule out possibility of embolic disease within  the left lower lobe. Arguing against this is the absence of any embolus elsewhere within the pulmonary arterial tree. Aortic Atherosclerosis (ICD10-I70.0) and Emphysema (ICD10-J43.9). Coronary artery calcification also present. Electronically Signed   By: Nelson Chimes M.D.   On: 11/28/2021 12:03   DG Chest Port 1 View  Result Date: 12/02/2021 CLINICAL DATA:  COVID positive.  Shortness of breath. EXAM: PORTABLE CHEST 1 VIEW COMPARISON:  Chest XR, 11/28/2021 and 11/27/2021. CT chest, 11/28/2021. FINDINGS: Cardiac silhouette is within normal limits. Coarse aortic calcifications. Hyperinflation with flattening of the diaphragms. Bibasilar consolidative opacities, LEFT-greater-than RIGHT. Small volume bilateral pleural effusions. No pneumothorax. No interval osseous abnormality. IMPRESSION: 1. Bibasilar opacities with small volume pleural effusions. Findings likely to represent atelectasis, however superimposed pneumonia could appear similar. 2. Radiographic findings of obstructive lung disease. 3.  Aortic Atherosclerosis (ICD10-I70.0). Electronically Signed   By: Michaelle Birks M.D.   On: 12/02/2021 08:19   DG Chest Port 1 View  Result Date: 11/28/2021 CLINICAL DATA:  Shortness of breath EXAM: PORTABLE CHEST 1 VIEW COMPARISON:  11/27/2021 FINDINGS: 0643 hours. Lungs are hyperexpanded. The cardio pericardial silhouette is enlarged. Interstitial markings are diffusely coarsened with chronic features. Persistent left base collapse/consolidation with small left pleural effusion, similar to prior. No focal airspace disease in the left lung. Bones are diffusely demineralized. Telemetry leads overlie the chest. IMPRESSION: 1. Stable exam. 2. Left base collapse/consolidation with small left pleural effusion. Electronically Signed   By: Misty Stanley M.D.   On: 11/28/2021 07:26   DG Chest Port 1 View  Result Date: 11/27/2021 CLINICAL DATA:  Sepsis,  short of breath, cough EXAM: PORTABLE CHEST 1 VIEW COMPARISON:  02/01/2021  FINDINGS: Single frontal view of the chest demonstrates dense left basilar consolidation, with small left pleural effusion. Background emphysema. No pneumothorax. Cardiac silhouette is stable. No acute bony abnormalities. IMPRESSION: 1. Left lower lobe pneumonia, with small parapneumonic effusion. Electronically Signed   By: Randa Ngo M.D.   On: 11/27/2021 18:17   VAS [place] LOWER EXTREMITY VENOUS (DVT)  Result Date: 11/28/2021  Lower Venous DVT Study Patient Name:  Shelia Holt  Date of Exam:   11/28/2021 Medical Rec #: [id number]        Accession #:    [id number] Date of Birth: [birth year]        Patient Gender: F Patient Age:   85 years Exam Location:  [facility] Procedure:      VAS [place] LOWER EXTREMITY VENOUS (DVT) Referring Phys: Deno Etienne [facility] --------------------------------------------------------------------------------  Indications: Elevated Ddimer.  Risk Factors: COVID 19 positive. Limitations: Body habitus and poor ultrasound/tissue interface. Comparison Study: No prior studies. Performing Technologist: Oliver Hum RVT  Examination Guidelines: A complete evaluation includes B-mode imaging, spectral Doppler, color Doppler, and power Doppler as needed of all accessible portions of each vessel. Bilateral testing is considered an integral part of a complete examination. Limited examinations for reoccurring indications may be performed as noted. The reflux portion of the exam is performed with the patient in reverse Trendelenburg.  +---------+---------------+---------+-----------+----------+--------------+  RIGHT     Compressibility Phasicity Spontaneity Properties Thrombus Aging  +---------+---------------+---------+-----------+----------+--------------+  CFV       Partial         Yes       Yes                    Acute           +---------+---------------+---------+-----------+----------+--------------+  SFJ       Full                                                              +---------+---------------+---------+-----------+----------+--------------+  FV Prox   Full                                                             +---------+---------------+---------+-----------+----------+--------------+  FV Mid    Full                                                             +---------+---------------+---------+-----------+----------+--------------+  FV Distal Full                                                             +---------+---------------+---------+-----------+----------+--------------+  PFV       Full                                                             +---------+---------------+---------+-----------+----------+--------------+  POP       Full            Yes       Yes                                    +---------+---------------+---------+-----------+----------+--------------+  PTV       Full                                                             +---------+---------------+---------+-----------+----------+--------------+  PERO      Full                                                             +---------+---------------+---------+-----------+----------+--------------+ Thrombus located in the common femoral vein is noted to be in the distal segment.  +---------+---------------+---------+-----------+----------+--------------+  LEFT      Compressibility Phasicity Spontaneity Properties Thrombus Aging  +---------+---------------+---------+-----------+----------+--------------+  CFV       Full            Yes       Yes                                    +---------+---------------+---------+-----------+----------+--------------+  SFJ       Full                                                             +---------+---------------+---------+-----------+----------+--------------+  FV Prox   Full                                                             +---------+---------------+---------+-----------+----------+--------------+  FV Mid    Full                                                              +---------+---------------+---------+-----------+----------+--------------+  FV Distal Full                                                             +---------+---------------+---------+-----------+----------+--------------+  PFV       Full                                                             +---------+---------------+---------+-----------+----------+--------------+  POP       Full            Yes       Yes                                    +---------+---------------+---------+-----------+----------+--------------+  PTV       Full                                                             +---------+---------------+---------+-----------+----------+--------------+  PERO      Full                                                             +---------+---------------+---------+-----------+----------+--------------+     Summary: RIGHT: - Findings consistent with acute deep vein thrombosis involving the right common femoral vein. - No cystic structure found in the popliteal fossa.  LEFT: - There is no evidence of deep vein thrombosis in the lower extremity. However, portions of this examination were limited- see technologist comments above.  - No cystic structure found in the popliteal fossa.  *See table(s) above for measurements and observations. Electronically signed by Harold Barban MD on 11/28/2021 at 11:29:25 PM.    Final    ECHOCARDIOGRAM LIMITED  Result Date: 11/28/2021    ECHOCARDIOGRAM LIMITED REPORT   Patient Name:   Shelia Holt Date of Exam: 11/28/2021 Medical Rec #:  [id number]       Height:       63.0 in Accession #:    [id number]      Weight:       95.0 lb Date of Birth:  04-Dec-1921       BSA:          1.409 m Patient Age:    61 years        BP:           119/50 mmHg Patient Gender: F               HR:           91 bpm. Exam Location:  Inpatient Procedure: Limited Echo, Limited Color Doppler and Cardiac Doppler Indications:    acute diastolic chf  History:         Patient has prior history of Echocardiogram examinations, most                 recent 06/04/2006. COPD and Covid. chronic kidney disease,                 Arrythmias:Atrial Fibrillation; Risk Factors:Diabetes,                 Dyslipidemia and Hypertension.  Sonographer:    Johny Chess RDCS Referring Phys: [place]  1. Left ventricular ejection fraction, by estimation, is 60 to 65%. The left ventricle has normal function. The left ventricle has no regional wall motion abnormalities.  2. Right ventricular systolic function is normal. The right ventricular size is normal. There is moderately elevated pulmonary  artery systolic pressure.  3. Left atrial size was severely dilated.  4. Right atrial size was severely dilated.  5. The mitral valve is abnormal. Moderate mitral valve regurgitation.  6. There is moderate calcification of the aortic valve. There is moderate thickening of the aortic valve. Aortic valve regurgitation is severe. Mild aortic valve stenosis with AVA 1.3cm2, mean gradient 58mmHg, Vmax 2.61m/s, DI 0.4.  7. The inferior vena cava is normal in size with greater than 50% respiratory variability, suggesting right atrial pressure of 3 mmHg. Comparison(s): Compared to prior echo report in 2007, there is now severe AR, mild AS, moderate MR (previously no significant valve disease). FINDINGS  Left Ventricle: Left ventricular ejection fraction, by estimation, is 60 to 65%. The left ventricle has normal function. The left ventricle has no regional wall motion abnormalities. The left ventricular internal cavity size was normal in size. There is  no left ventricular hypertrophy. Right Ventricle: The right ventricular size is normal. Right ventricular systolic function is normal. There is moderately elevated pulmonary artery systolic pressure. The tricuspid regurgitant velocity is 3.26 m/s, and with an assumed right atrial pressure of 3 mmHg, the estimated right ventricular systolic  pressure is 63.8 mmHg. Left Atrium: Left atrial size was severely dilated. Right Atrium: Right atrial size was severely dilated. Mitral Valve: The mitral valve is abnormal. There is mild thickening of the mitral valve leaflet(s). There is mild calcification of the mitral valve leaflet(s). Mild mitral annular calcification. Moderate mitral valve regurgitation. Tricuspid Valve: The tricuspid valve is normal in structure. Tricuspid valve regurgitation is mild. Aortic Valve: AVA 1.3cm2 by continuity, mean gradient 73mmHg, Vmax 2.18m/s, DI 0.4. There is moderate calcification of the aortic valve. There is moderate thickening of the aortic valve. Aortic valve regurgitation is moderate to severe. Mild aortic stenosis is present. Pulmonic Valve: The pulmonic valve was normal in structure. Pulmonic valve regurgitation is trivial. Aorta: The aortic root and ascending aorta are structurally normal, with no evidence of dilitation. Venous: The inferior vena cava is normal in size with greater than 50% respiratory variability, suggesting right atrial pressure of 3 mmHg. LEFT VENTRICLE PLAX 2D LVIDd:         3.70 cm LVIDs:         2.60 cm LV PW:         0.80 cm LV IVS:        0.70 cm LVOT diam:     1.50 cm LVOT Area:     1.77 cm  IVC IVC diam: 1.60 cm LEFT ATRIUM         Index LA diam:    4.10 cm 2.91 cm/m   AORTA Ao Root diam: 2.80 cm Ao Asc diam:  2.60 cm TRICUSPID VALVE TR Peak grad:   42.5 mmHg TR Vmax:        326.00 cm/s  SHUNTS Systemic Diam: 1.50 cm Gwyndolyn Kaufman MD Electronically signed by Gwyndolyn Kaufman MD Signature Date/Time: 11/28/2021/6:00:53 PM    Final     Recent Results (from the past 240 hour(s))  Resp Panel by RT-PCR (Flu A&B, Covid) Nasopharyngeal Swab     Status: Abnormal   Collection Time: 11/27/21  5:19 PM   Specimen: Nasopharyngeal Swab; Nasopharyngeal(NP) swabs in vial transport medium  Result Value Ref Range Status   SARS Coronavirus 2 by RT PCR POSITIVE (A) NEGATIVE Final    Comment:  (NOTE) SARS-CoV-2 target nucleic acids are DETECTED.  The SARS-CoV-2 RNA is generally detectable in upper respiratory specimens during the acute  phase of infection. Positive results are indicative of the presence of the identified virus, but do not rule out bacterial infection or co-infection with other pathogens not detected by the test. Clinical correlation with patient history and other diagnostic information is necessary to determine patient infection status. The expected result is Negative.  Fact Sheet for Patients: EntrepreneurPulse.com.au  Fact Sheet for Healthcare Providers: IncredibleEmployment.be  This test is not yet approved or cleared by the [place] FDA and  has been authorized for detection and/or diagnosis of SARS-CoV-2 by FDA under an Emergency Use Authorization (EUA).  This EUA will remain in effect (meaning this test can be used) for the duration of  the COVID-19 declaration under Section 564(b)(1) of the [place] ct, 21 U.S.C. section 360bbb-3(b)(1), unless the authorization is terminated or revoked sooner.     Influenza A by PCR NEGATIVE NEGATIVE Final   Influenza B by PCR NEGATIVE NEGATIVE Final    Comment: (NOTE) The Xpert Xpress SARS-CoV-2/FLU/RSV plus assay is intended as an aid in the diagnosis of influenza from Nasopharyngeal swab specimens and should not be used as a sole basis for treatment. Nasal washings and aspirates are unacceptable for Xpert Xpress SARS-CoV-2/FLU/RSV testing.  Fact Sheet for Patients: EntrepreneurPulse.com.au  Fact Sheet for Healthcare Providers: IncredibleEmployment.be  This test is not yet approved or cleared by the [place] FDA and has been authorized for detection and/or diagnosis of SARS-CoV-2 by FDA under an Emergency Use Authorization (EUA). This EUA will remain in effect (meaning this test can be used) for the duration of the COVID-19  declaration under Section 564(b)(1) of the Act, 21 U.S.C. section 360bbb-3(b)(1), unless the authorization is terminated or revoked.  Performed at [facility], [place] [address], [place], [place] [address]   Urine Culture     Status: Abnormal   Collection Time: 11/27/21  5:19 PM   Specimen: In/Out Cath Urine  Result Value Ref Range Status   Specimen Description IN/OUT CATH URINE  Final   Special Requests   Final    NONE Performed at [facility], [place] [address] [place], [place] [address]    Culture MULTIPLE SPECIES PRESENT, SUGGEST RECOLLECTION (A)  Final   Report Status 11/29/2021 FINAL  Final  Blood Culture (routine x 2)     Status: None   Collection Time: 11/27/21  5:26 PM   Specimen: BLOOD  Result Value Ref Range Status   Specimen Description BLOOD SITE NOT SPECIFIED  Final   Special Requests   Final    BOTTLES DRAWN AEROBIC AND ANAEROBIC Blood Culture adequate volume   Culture   Final    NO GROWTH 5 DAYS Performed at [facility], [place] [address], [place], [place] [address]    Report Status 12/02/2021 FINAL  Final  Blood Culture (routine x 2)     Status: None   Collection Time: 11/27/21  5:31 PM   Specimen: BLOOD  Result Value Ref Range Status   Specimen Description BLOOD SITE NOT SPECIFIED  Final   Special Requests   Final    BOTTLES DRAWN AEROBIC AND ANAEROBIC Blood Culture adequate volume   Culture   Final    NO GROWTH 5 DAYS Performed at [facility], [address], [place], [place] [address]    Report Status 12/02/2021 FINAL  Final  MRSA Next Gen by PCR, Nasal     Status: None   Collection Time: 11/28/21  9:11 AM   Specimen: Nasal Mucosa; Nasal Swab  Result Value Ref Range Status   MRSA by PCR Next Gen NOT DETECTED NOT DETECTED Final    Comment: (NOTE) The GeneXpert MRSA Assay (FDA approved for NASAL specimens only), is one component of a comprehensive MRSA colonization surveillance program. It is not intended to diagnose MRSA  infection nor to guide or monitor treatment for MRSA infections. Test performance is not FDA approved in patients less than 86 years old. Performed at [facility], [place] [address], [place], [place] [address]   Expectorated Sputum Assessment w Gram Stain, Rflx to Resp Cult     Status: None   Collection Time: 11/29/21 10:58 AM   Specimen: Expectorated Sputum  Result Value Ref Range Status   Specimen Description EXPECTORATED SPUTUM  Final   Special Requests NONE  Final   Sputum evaluation   Final    Sputum specimen not acceptable for testing.  Please recollect.   Gram Stain Report Called to,Read Back By and Verified With: K. HARDER [id number] @1426  FH  Performed at [place] [address], [place], [place] [address]    Report Status 11/30/2021 FINAL  Final    Today   Subjective    Shelia Holt today has no headache, no chest pain but positive nausea and shortness of breath, wants to go home   Objective   Blood pressure 97/66, pulse (!) 102, temperature 98 F (36.7 C), temperature source Oral, resp. rate 20, height 5\' 4"  (1.626 m), weight 45 kg, SpO2 100 %.   Intake/Output Summary (Last 24 hours) at 12/02/2021 0951 Last data filed at 12/02/2021 0100 Gross per 24 hour  Intake 1212.7 ml  Output 200 ml  Net 1012.7 ml    Exam  Awake Alert, No new F.N deficits,  .AT,PERRAL Supple Neck,No JVD, No cervical lymphadenopathy appriciated.  Symmetrical Chest wall movement, moderate air movement bilaterally but coarse bilateral breath sounds with crackles RRR,No Gallops,Rubs or new Murmurs, No Parasternal Heave +ve B.Sounds, Abd Soft, Non tender, No organomegaly appriciated, No rebound -guarding or rigidity. No Cyanosis, Clubbing or edema, No new Rash or bruise   Data Review   CBC w Diff:  Lab Results  Component Value Date   WBC 24.5 (H) 12/02/2021   HGB 8.3 (L) 12/02/2021   HGB 11.3 (L) 10/28/2006   HCT 26.3 (L) 12/02/2021   HCT 32.3 (L) 10/28/2006   PLT 498  (H) 12/02/2021   PLT 211 10/28/2006   LYMPHOPCT PENDING 12/02/2021   LYMPHOPCT 20.6 10/28/2006   BANDSPCT PENDING 12/02/2021   MONOPCT PENDING 12/02/2021   MONOPCT 7.7 10/28/2006   EOSPCT PENDING 12/02/2021   EOSPCT 2.5 10/28/2006   BASOPCT PENDING 12/02/2021   BASOPCT 0.3 10/28/2006    CMP:  Lab Results  Component Value Date   NA 135 12/01/2021   K 3.5 12/01/2021   CL 101 12/01/2021   CO2 24 12/01/2021   BUN 38 (H) 12/01/2021   CREATININE 1.00 12/01/2021   PROT 4.9 (L) 12/01/2021   ALBUMIN 1.7 (L) 12/01/2021   BILITOT 0.4 12/01/2021   ALKPHOS 24 (L) 12/01/2021   AST 20 12/01/2021   ALT 9 12/01/2021  .   Total Time in preparing paper work, data evaluation and todays exam - 51 minutes  Lala Lund M.D on 12/02/2021 at 9:51 AM  [facility]

## 2021-12-02 NOTE — TOC Transition Note (Addendum)
Transition of Care [facility]) - CM/SW Discharge Note   Patient Details  Name: Shelia Holt MRN: [id number] Date of Birth: Apr 17, 1922  [facility] CM/SW Contact:  Carles Collet, RN Phone Number: 12/02/2021, 10:47 AM   Clinical Narrative:    Patient will transition to home w hospice care through [facility] today. Anticipate delivery of DME including home Oxygen around 3pm today. Will schedule PTAR for later this afternoon. Forms and DNR on chart.   Provided w Eliquis coupon.    Final next level of care: Home w Hospice Care Barriers to Discharge: No Barriers Identified   Patient Goals and CMS Choice Patient states their goals for this hospitalization and ongoing recovery are:: home with hospice CMS Medicare.gov Compare Post Acute Care list provided to:: Patient Represenative (must comment) Choice offered to / list presented to : Adult Children  Discharge Placement                       Discharge Plan and Services   Discharge Planning Services: CM Consult            DME Arranged: Oxygen DME Agency: NA     Representative spoke with at DME Agency: Per [place]: RN [place] Agency:  Lonia Chimera) Date [place]: 12/02/21 Time [place]: [id number] Representative spoke with at [place]: Venia Carbon RN  Social Determinants of Health (SDOH) Interventions     Readmission Risk Interventions No flowsheet data found.

## 2021-12-02 NOTE — Progress Notes (Signed)
° °  Palliative Medicine Inpatient Follow Up Note   Plan for discharge home this afternoon with hospice.  No charge.  ______________________________________________________________________________________ [place] Team Team Cell Phone: [phone] Please utilize secure chat with additional questions, if there is no response within 30 minutes please call the above phone number  Palliative Medicine Team providers are available by phone from 7am to 7pm daily and can be reached through the team cell phone.  Should this patient require assistance outside of these hours, please call the patient's attending physician.

## 2021-12-02 NOTE — Progress Notes (Signed)
Noted that pt will have SOB,labored breathing, tachypnic, tachy  HR max 125 and continuously coughing right after activity like  turning the pt to wash her up. Tech also notified this RN that right after the daughter assisted the pt to bedside commode, suddenly, pt complains SOB.  Notified DR Candiss Norse this am d/t pt has respiratory distress again now she is clammy and sweating HR 130s and rr 30's.O2 was bump to 10L, lasix IV 60 mg and nebulize as ordered. Chest Xray was ordered as well. After Few min, pt felt better. Pass on day shift.

## 2021-12-02 NOTE — Progress Notes (Signed)
Patient discharged home with hospice. Oxygen has been delivered to house. PTAR providing transportation. Daughter present for discharge instructions. Questions answered.

## 2021-12-02 NOTE — Progress Notes (Addendum)
Manufacturing engineer (ACC)  Spoke with [name] so solidify d/c plans.   Plan to d/c later today once necessary DME is in place.  DME needed: 02 and BSC, ACC will order and update hospital with ETA.  She will need ambulance transport home and comfort medications.  Thank you, Venia Carbon RN, BSN [facility] Liaison  **DME set to be delivered by 2. Updated TOC manager.   **RN staff, please update Terri once transport arrives if she is not present in the room so the family will know when to meet her at her home.

## 2021-12-03 DIAGNOSIS — N1831 Chronic kidney disease, stage 3a: Secondary | ICD-10-CM | POA: Diagnosis not present

## 2021-12-03 DIAGNOSIS — I2510 Atherosclerotic heart disease of native coronary artery without angina pectoris: Secondary | ICD-10-CM | POA: Diagnosis not present

## 2021-12-03 DIAGNOSIS — J1282 Pneumonia due to coronavirus disease 2019: Secondary | ICD-10-CM | POA: Diagnosis not present

## 2021-12-03 DIAGNOSIS — K649 Unspecified hemorrhoids: Secondary | ICD-10-CM | POA: Diagnosis not present

## 2021-12-03 DIAGNOSIS — U071 COVID-19: Secondary | ICD-10-CM | POA: Diagnosis not present

## 2021-12-03 DIAGNOSIS — H3530 Unspecified macular degeneration: Secondary | ICD-10-CM | POA: Diagnosis not present

## 2021-12-03 DIAGNOSIS — D631 Anemia in chronic kidney disease: Secondary | ICD-10-CM | POA: Diagnosis not present

## 2021-12-03 DIAGNOSIS — H409 Unspecified glaucoma: Secondary | ICD-10-CM | POA: Diagnosis not present

## 2021-12-03 DIAGNOSIS — I6529 Occlusion and stenosis of unspecified carotid artery: Secondary | ICD-10-CM | POA: Diagnosis not present

## 2021-12-03 DIAGNOSIS — J9621 Acute and chronic respiratory failure with hypoxia: Secondary | ICD-10-CM | POA: Diagnosis not present

## 2021-12-03 DIAGNOSIS — Z86018 Personal history of other benign neoplasm: Secondary | ICD-10-CM | POA: Diagnosis not present

## 2021-12-03 DIAGNOSIS — E1122 Type 2 diabetes mellitus with diabetic chronic kidney disease: Secondary | ICD-10-CM | POA: Diagnosis not present

## 2021-12-03 DIAGNOSIS — J449 Chronic obstructive pulmonary disease, unspecified: Secondary | ICD-10-CM | POA: Diagnosis not present

## 2021-12-03 DIAGNOSIS — Z681 Body mass index (BMI) 19 or less, adult: Secondary | ICD-10-CM | POA: Diagnosis not present

## 2021-12-03 DIAGNOSIS — E43 Unspecified severe protein-calorie malnutrition: Secondary | ICD-10-CM | POA: Diagnosis not present

## 2021-12-03 DIAGNOSIS — E785 Hyperlipidemia, unspecified: Secondary | ICD-10-CM | POA: Diagnosis not present

## 2021-12-03 DIAGNOSIS — K219 Gastro-esophageal reflux disease without esophagitis: Secondary | ICD-10-CM | POA: Diagnosis not present

## 2021-12-03 DIAGNOSIS — I129 Hypertensive chronic kidney disease with stage 1 through stage 4 chronic kidney disease, or unspecified chronic kidney disease: Secondary | ICD-10-CM | POA: Diagnosis not present

## 2021-12-03 LAB — GLUCOSE, CAPILLARY: Glucose-Capillary: 341 mg/dL — ABNORMAL HIGH (ref 70–99)

## 2021-12-04 DIAGNOSIS — Z681 Body mass index (BMI) 19 or less, adult: Secondary | ICD-10-CM | POA: Diagnosis not present

## 2021-12-04 DIAGNOSIS — I2510 Atherosclerotic heart disease of native coronary artery without angina pectoris: Secondary | ICD-10-CM | POA: Diagnosis not present

## 2021-12-04 DIAGNOSIS — U071 COVID-19: Secondary | ICD-10-CM | POA: Diagnosis not present

## 2021-12-04 DIAGNOSIS — J9621 Acute and chronic respiratory failure with hypoxia: Secondary | ICD-10-CM | POA: Diagnosis not present

## 2021-12-04 DIAGNOSIS — E43 Unspecified severe protein-calorie malnutrition: Secondary | ICD-10-CM | POA: Diagnosis not present

## 2021-12-04 DIAGNOSIS — J1282 Pneumonia due to coronavirus disease 2019: Secondary | ICD-10-CM | POA: Diagnosis not present

## 2021-12-05 DIAGNOSIS — I2510 Atherosclerotic heart disease of native coronary artery without angina pectoris: Secondary | ICD-10-CM | POA: Diagnosis not present

## 2021-12-05 DIAGNOSIS — E43 Unspecified severe protein-calorie malnutrition: Secondary | ICD-10-CM | POA: Diagnosis not present

## 2021-12-05 DIAGNOSIS — J9621 Acute and chronic respiratory failure with hypoxia: Secondary | ICD-10-CM | POA: Diagnosis not present

## 2021-12-05 DIAGNOSIS — J1282 Pneumonia due to coronavirus disease 2019: Secondary | ICD-10-CM | POA: Diagnosis not present

## 2021-12-05 DIAGNOSIS — Z681 Body mass index (BMI) 19 or less, adult: Secondary | ICD-10-CM | POA: Diagnosis not present

## 2021-12-05 DIAGNOSIS — U071 COVID-19: Secondary | ICD-10-CM | POA: Diagnosis not present

## 2021-12-07 DIAGNOSIS — I2510 Atherosclerotic heart disease of native coronary artery without angina pectoris: Secondary | ICD-10-CM | POA: Diagnosis not present

## 2021-12-07 DIAGNOSIS — Z681 Body mass index (BMI) 19 or less, adult: Secondary | ICD-10-CM | POA: Diagnosis not present

## 2021-12-07 DIAGNOSIS — E43 Unspecified severe protein-calorie malnutrition: Secondary | ICD-10-CM | POA: Diagnosis not present

## 2021-12-07 DIAGNOSIS — U071 COVID-19: Secondary | ICD-10-CM | POA: Diagnosis not present

## 2021-12-07 DIAGNOSIS — J9621 Acute and chronic respiratory failure with hypoxia: Secondary | ICD-10-CM | POA: Diagnosis not present

## 2021-12-07 DIAGNOSIS — J1282 Pneumonia due to coronavirus disease 2019: Secondary | ICD-10-CM | POA: Diagnosis not present

## 2021-12-11 DIAGNOSIS — J9621 Acute and chronic respiratory failure with hypoxia: Secondary | ICD-10-CM | POA: Diagnosis not present

## 2021-12-11 DIAGNOSIS — U071 COVID-19: Secondary | ICD-10-CM | POA: Diagnosis not present

## 2021-12-11 DIAGNOSIS — Z681 Body mass index (BMI) 19 or less, adult: Secondary | ICD-10-CM | POA: Diagnosis not present

## 2021-12-11 DIAGNOSIS — E43 Unspecified severe protein-calorie malnutrition: Secondary | ICD-10-CM | POA: Diagnosis not present

## 2021-12-11 DIAGNOSIS — I2510 Atherosclerotic heart disease of native coronary artery without angina pectoris: Secondary | ICD-10-CM | POA: Diagnosis not present

## 2021-12-11 DIAGNOSIS — J1282 Pneumonia due to coronavirus disease 2019: Secondary | ICD-10-CM | POA: Diagnosis not present

## 2021-12-17 DIAGNOSIS — E43 Unspecified severe protein-calorie malnutrition: Secondary | ICD-10-CM | POA: Diagnosis not present

## 2021-12-17 DIAGNOSIS — J9621 Acute and chronic respiratory failure with hypoxia: Secondary | ICD-10-CM | POA: Diagnosis not present

## 2021-12-17 DIAGNOSIS — I2510 Atherosclerotic heart disease of native coronary artery without angina pectoris: Secondary | ICD-10-CM | POA: Diagnosis not present

## 2021-12-17 DIAGNOSIS — Z681 Body mass index (BMI) 19 or less, adult: Secondary | ICD-10-CM | POA: Diagnosis not present

## 2021-12-17 DIAGNOSIS — U071 COVID-19: Secondary | ICD-10-CM | POA: Diagnosis not present

## 2021-12-17 DIAGNOSIS — J1282 Pneumonia due to coronavirus disease 2019: Secondary | ICD-10-CM | POA: Diagnosis not present

## 2021-12-26 DEATH — deceased

## 2022-04-16 IMAGING — CT CT HIP*L* W/O CM
2 of 3 series · 17 of 46 positions shown, 19 images · non-contrast
Comparison: Plain films left hip 02/01/2021.

CLINICAL DATA: Left hip pain and difficulty walking since the
patient fell due to a syncopal episode 01/29/2021.

EXAM:
CT OF THE LEFT HIP WITHOUT CONTRAST
TECHNIQUE: Multidetector CT imaging of the left hip was performed according to
the standard protocol. Multiplanar CT image reconstructions were
also generated.

[Series 3: axial soft tissue · axial · 0.40mm/px · z∈[-599,-427]mm · 14 of 100 slices shown, 16 images]
[im 7/100  soft-tissue]
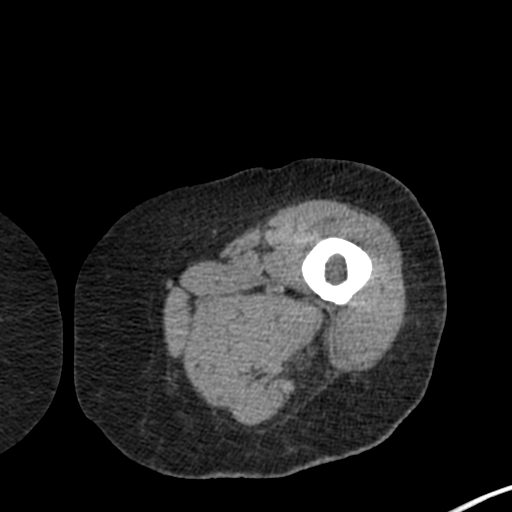
[im 7/100  bone]
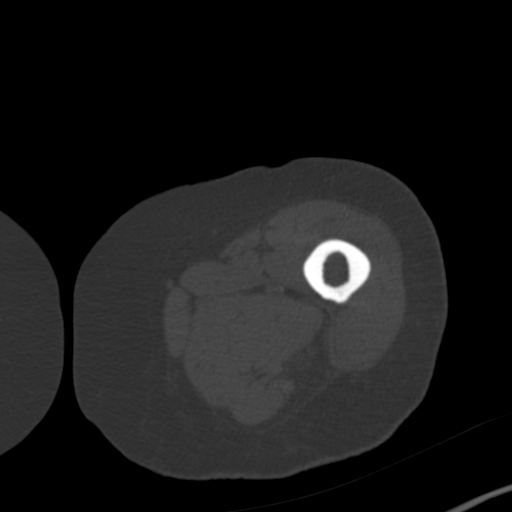
[im 13/100  soft-tissue]
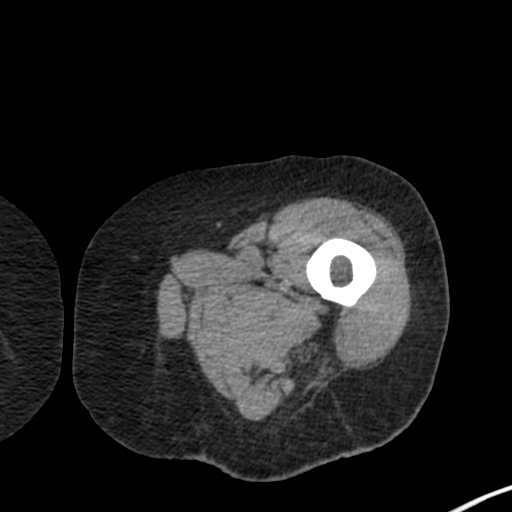
[im 20/100  soft-tissue]
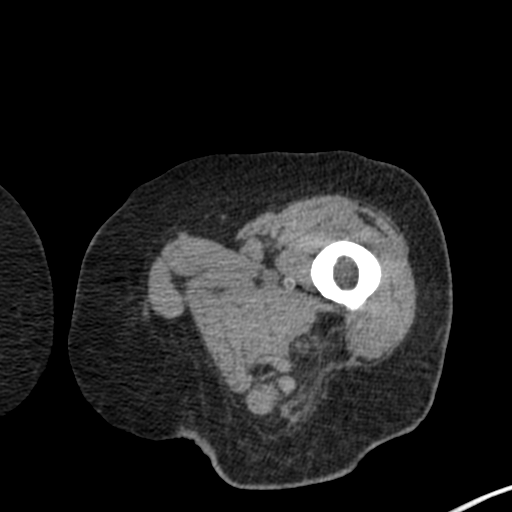
[im 26/100  soft-tissue]
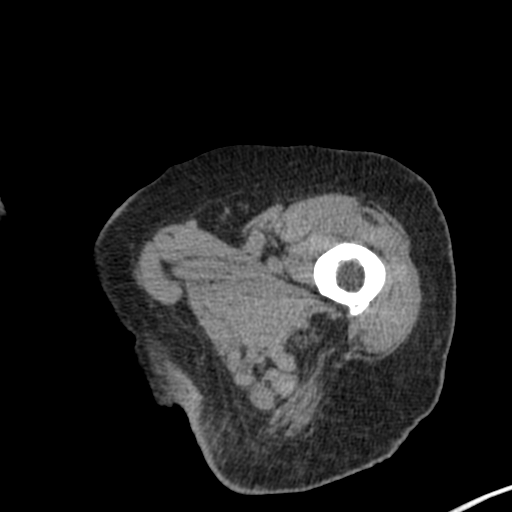
[im 32/100  soft-tissue]
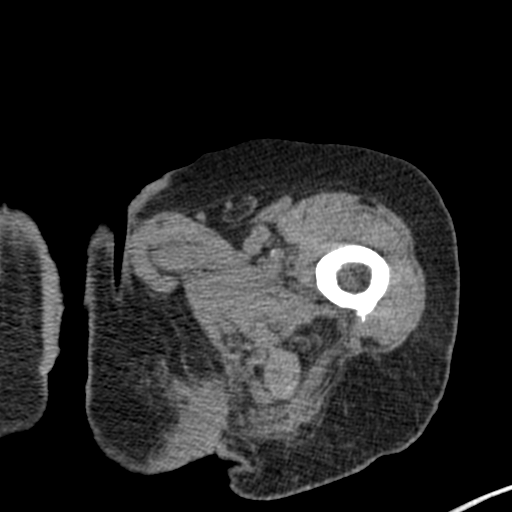
[im 39/100  soft-tissue]
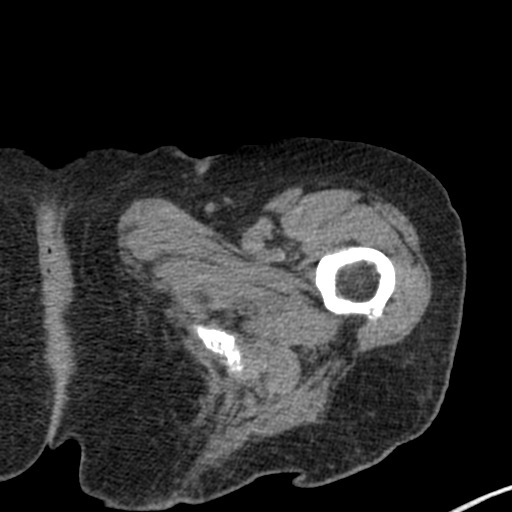
[im 45/100  soft-tissue]
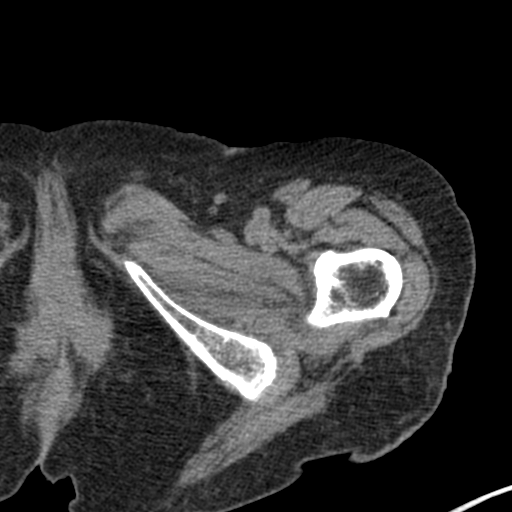
[im 55/100  soft-tissue]
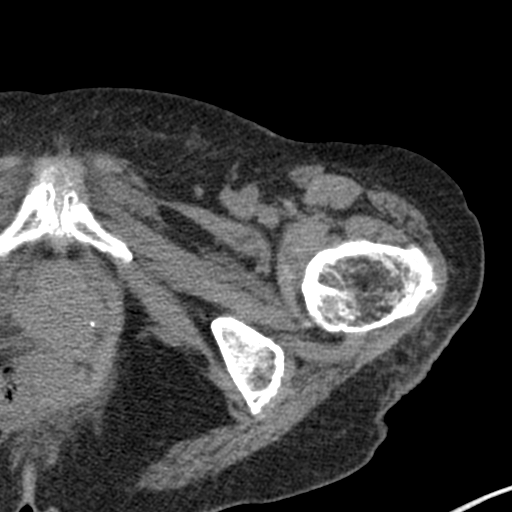
[im 61/100  soft-tissue]
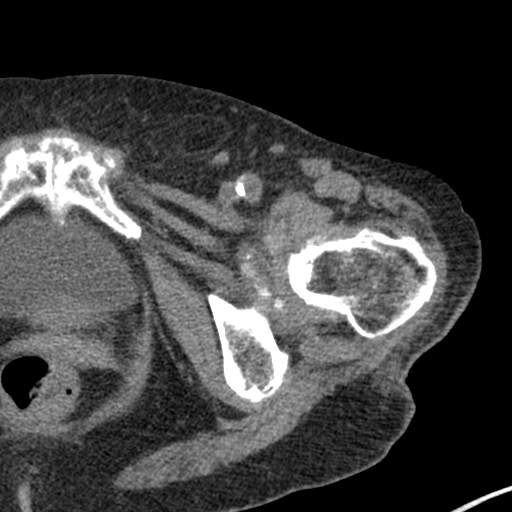
[im 61/100  bone]
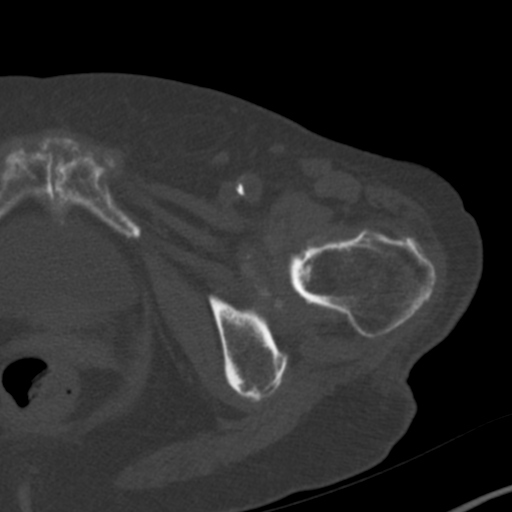
[im 68/100  soft-tissue]
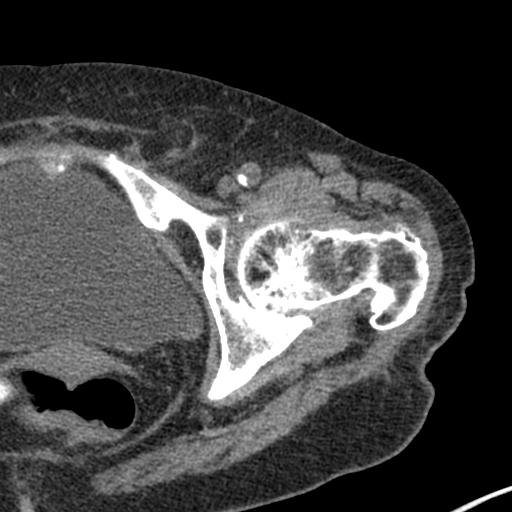
[im 74/100  soft-tissue]
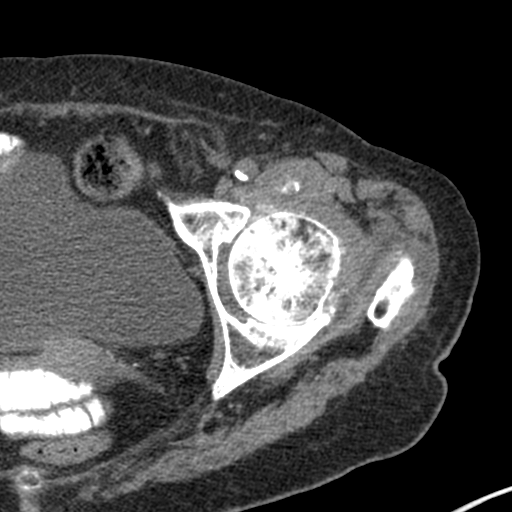
[im 80/100  soft-tissue]
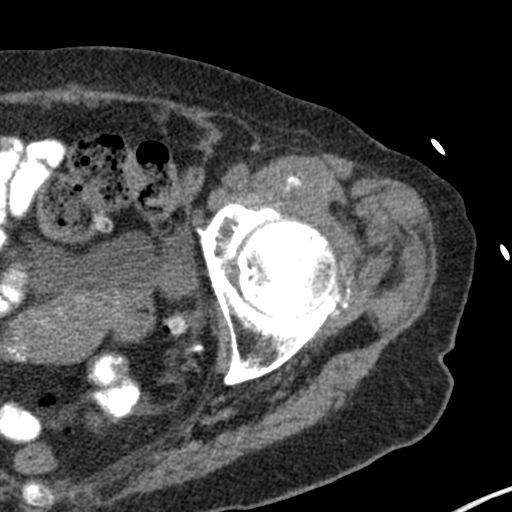
[im 87/100  soft-tissue]
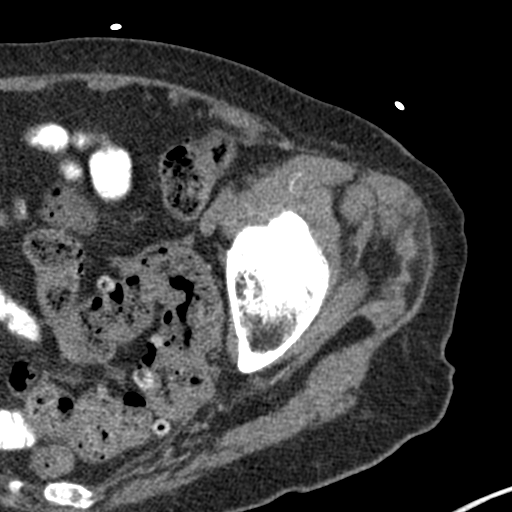
[im 93/100  soft-tissue]
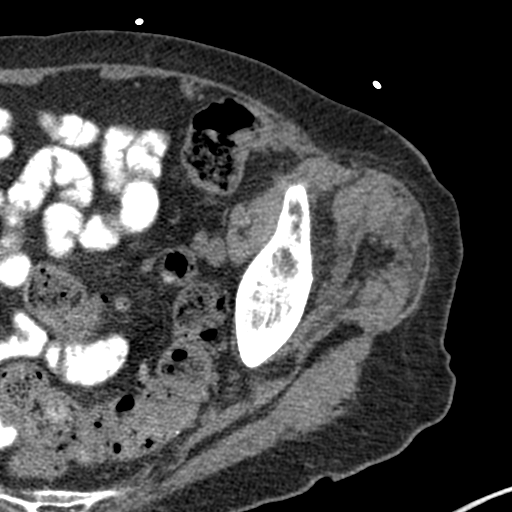

[Series 8: coronal st · coronal · 0.36mm/px · 3 of 68 slices shown]
[im 23/68  soft-tissue]
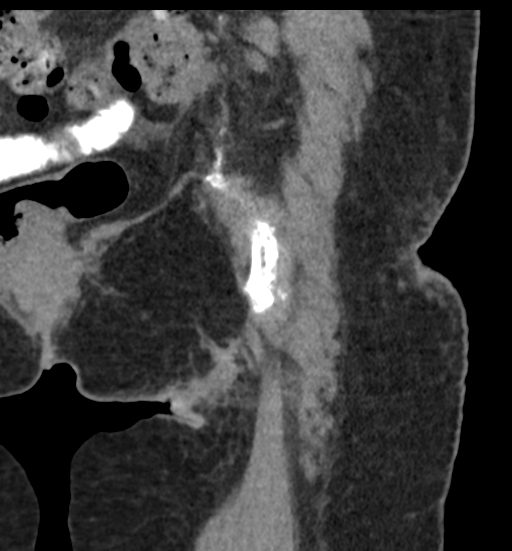
[im 30/68  soft-tissue]
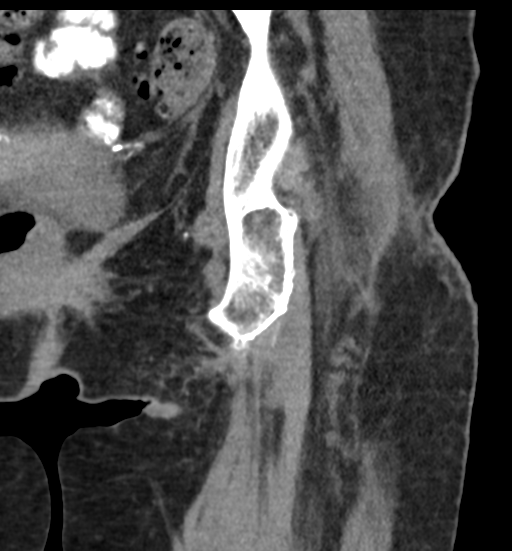
[im 38/68  soft-tissue]
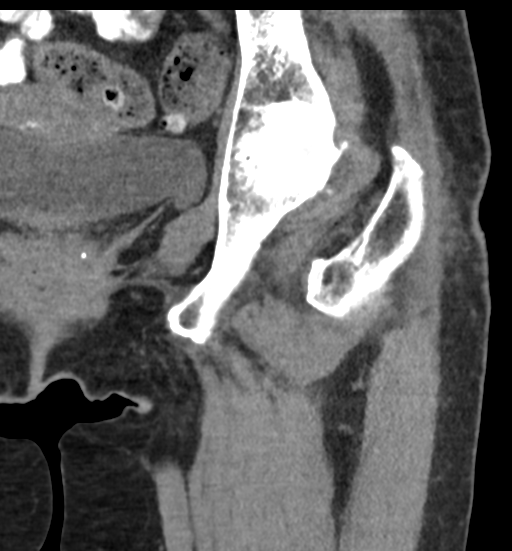

[17 of 46 positions shown; findings below may reference images not displayed]

FINDINGS: Bones/Joint/Cartilage

There is no fracture or dislocation. Mild left hip joint space
narrowing and osteophytosis are seen. There is some
chondrocalcinosis about the hip. No joint effusion. Degenerative
disease at the symphysis pubis is noted.

Ligaments

Suboptimally assessed by CT.

Muscles and Tendons

Appear normal without strain, tear, atrophy or focal lesion.

Soft tissues

Imaged intrapelvic contents demonstrate sigmoid diverticulosis.
IMPRESSION: No acute abnormality.

Mild to moderate appearing left hip osteoarthritis. Degenerative
change at the symphysis pubis also noted.

Diverticulosis.

## 2022-04-16 IMAGING — CT CT ABD-PELV W/ CM
2 of 5 series · 15 of 46 positions shown, 17 images · IV contrast (Omnipaque)
Comparison: 03/26/2011 CT abdomen/pelvis.

CLINICAL DATA: Fall with left hip pain. Concern for diverticulitis.

EXAM:
CT ABDOMEN AND PELVIS WITH CONTRAST
TECHNIQUE: Multidetector CT imaging of the abdomen and pelvis was performed
using the standard protocol following bolus administration of
intravenous contrast.
CONTRAST:  100mL OMNIPAQUE IOHEXOL 300 MG/ML  SOLN

[Series 2: axial st · axial · 0.63mm/px · z∈[-530,-170]mm · 12 of 80 slices shown, 14 images]
[im 4/80  soft-tissue]
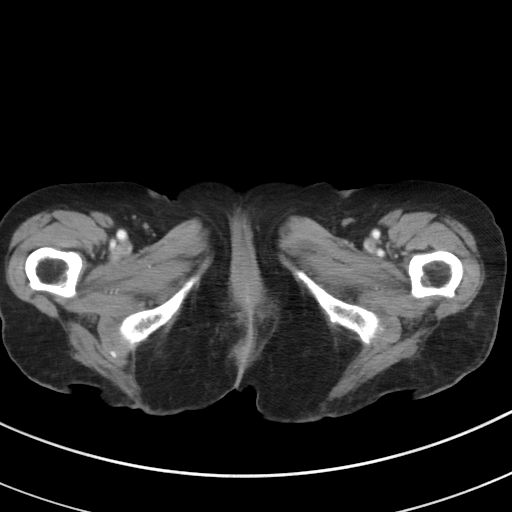
[im 4/80  bone]
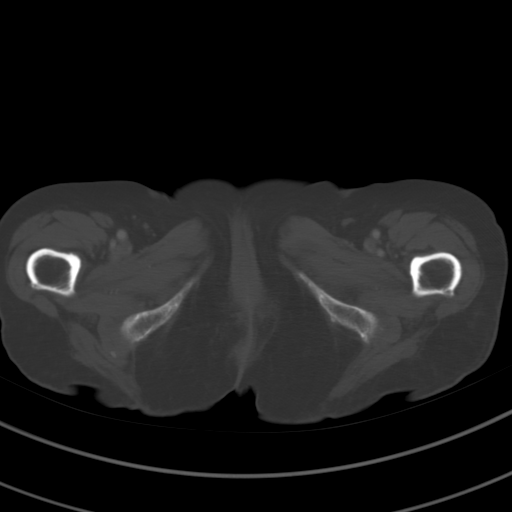
[im 12/80  soft-tissue]
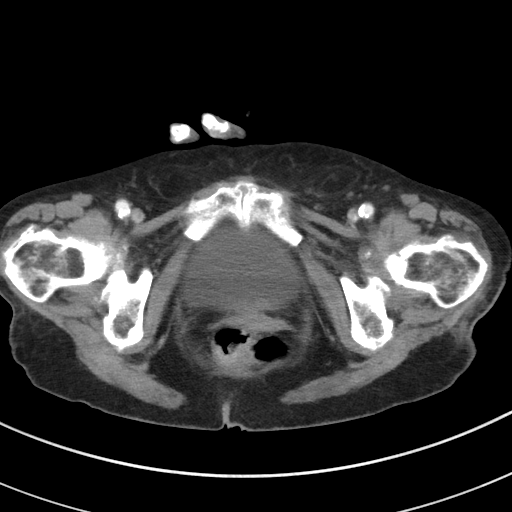
[im 16/80  soft-tissue]
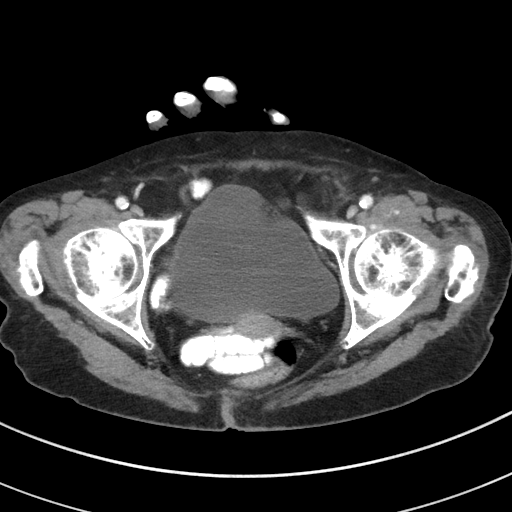
[im 24/80  soft-tissue]
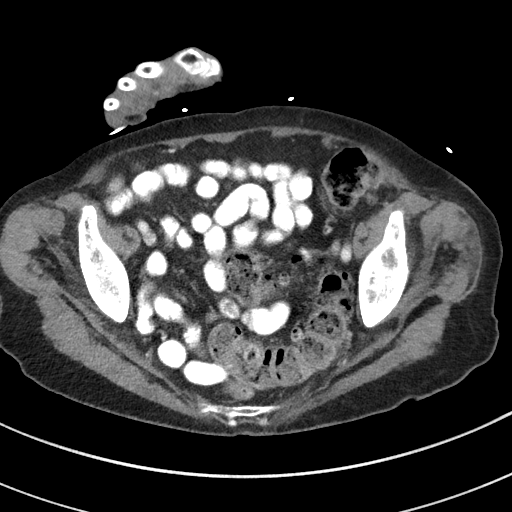
[im 32/80  soft-tissue]
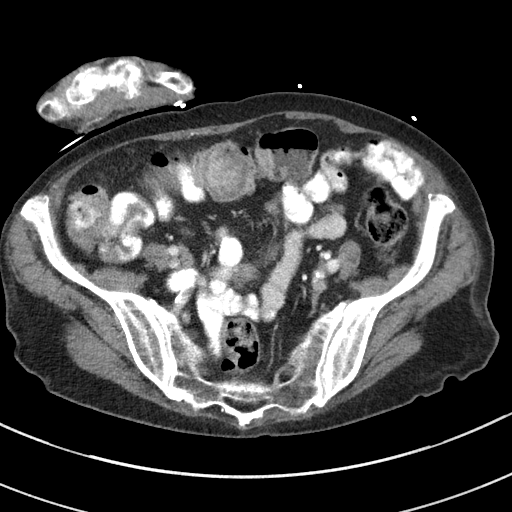
[im 36/80  soft-tissue]
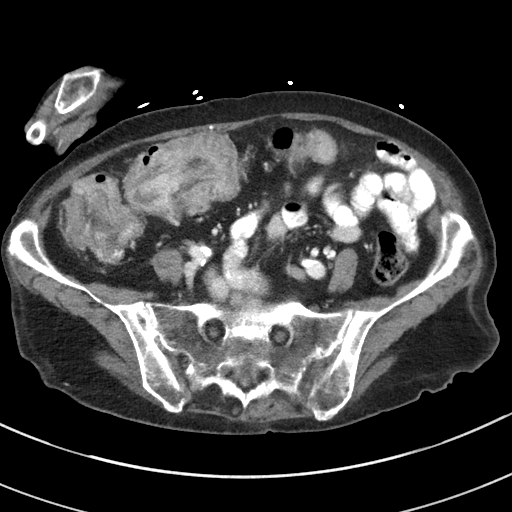
[im 44/80  soft-tissue]
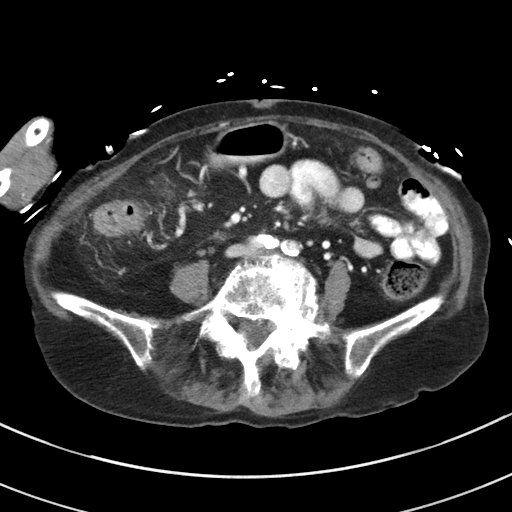
[im 48/80  soft-tissue]
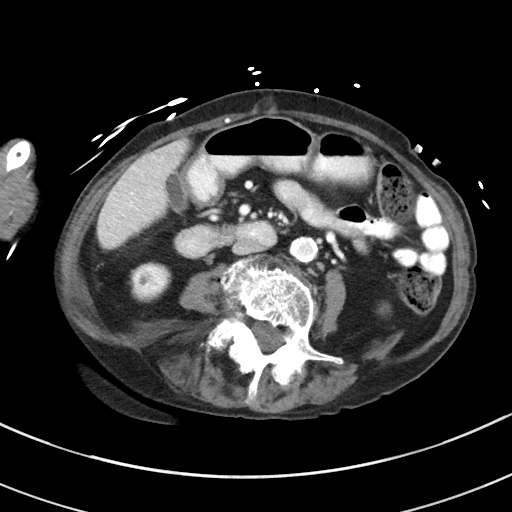
[im 56/80  soft-tissue]
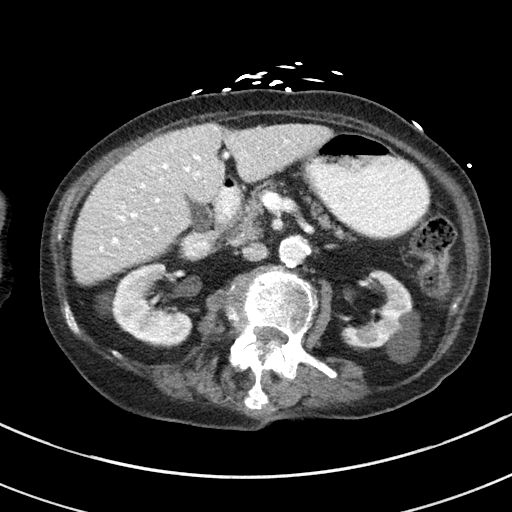
[im 56/80  bone]
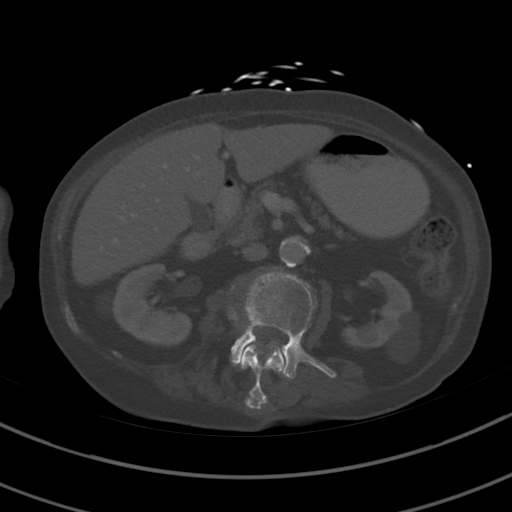
[im 64/80  soft-tissue]
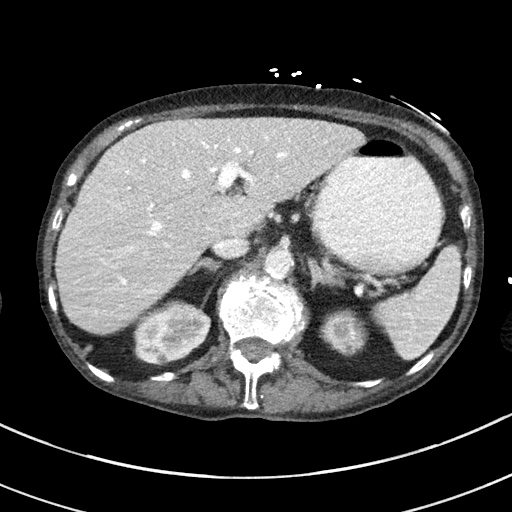
[im 68/80  soft-tissue]
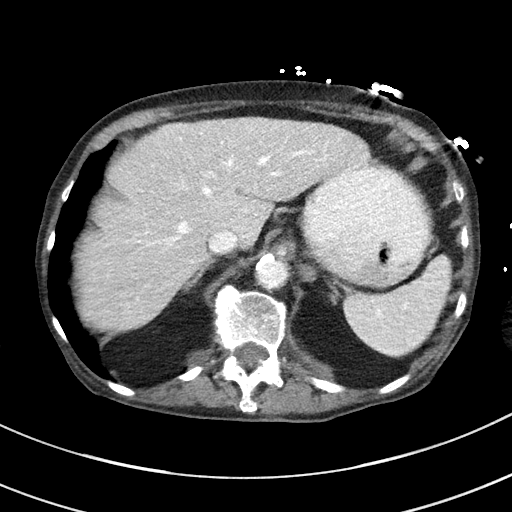
[im 76/80  soft-tissue]
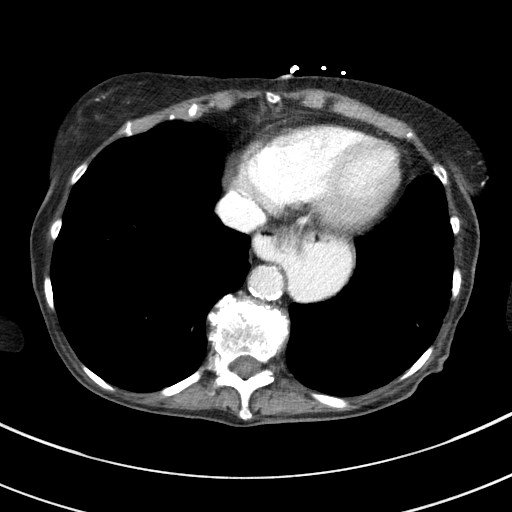

[Series 5: coronal st · coronal · 0.59mm/px · 3 of 75 slices shown]
[im 25/75  soft-tissue]
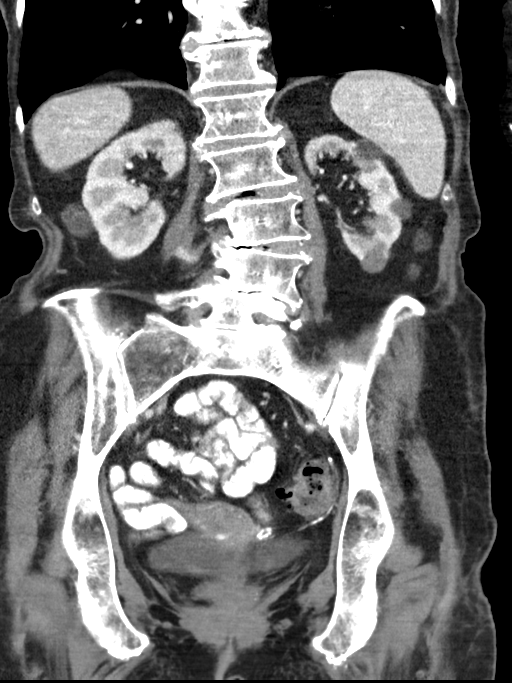
[im 33/75  soft-tissue]
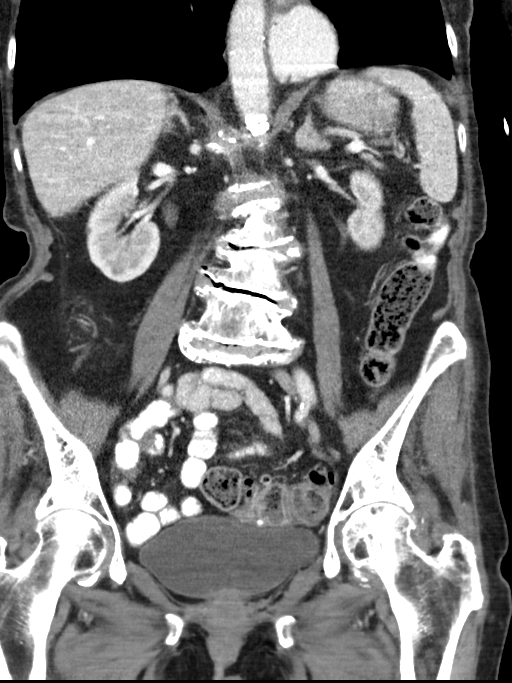
[im 42/75  soft-tissue]
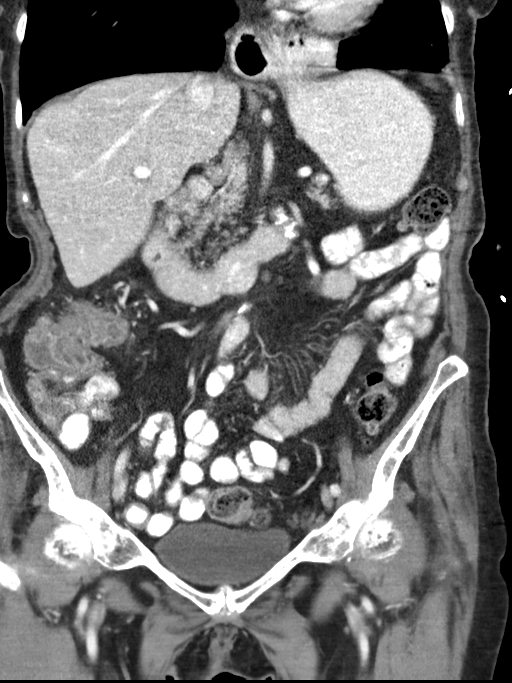

[15 of 46 positions shown; findings below may reference images not displayed]

FINDINGS: Lower chest: Vaguely nodular and bandlike subpleural focus of
consolidation in the peripheral basilar left lower lobe measuring
1.1 cm (series 3/image 9), new.

Hepatobiliary: Normal liver size. No liver mass. Normal gallbladder
with no radiopaque cholelithiasis. No biliary ductal dilatation.

Pancreas: Normal, with no mass or duct dilation.

Spleen: Normal size. No mass.

Adrenals/Urinary Tract: Normal adrenals. Multiple bilateral simple
renal cysts, largest 2.7 cm in the interpolar left kidney. Hypodense
1.7 cm lower left renal cortical lesion, previously hyperdense on
noncontrast 03/26/2011 CT measuring 1.0 cm, increased. Several
subcentimeter hypodense renal cortical lesions in both kidneys are
too small to characterize. No hydronephrosis. Normal bladder.

Stomach/Bowel: Moderate hiatal hernia. Otherwise normal stomach.
Normal caliber small bowel with no small bowel wall thickening.
Normal appendix. Oral contrast transits to the cecum. Colo colonic
intussusception at the level of the hepatic flexure of the colon
with suggestion of irregular annular colonic wall thickening at the
lead point (series 2/image 45). Mild left colonic diverticulosis. No
additional sites of large bowel wall thickening.

Vascular/Lymphatic: Atherosclerotic nonaneurysmal abdominal aorta.
Patent portal, splenic, hepatic and renal veins. No pathologically
enlarged lymph nodes in the abdomen or pelvis.

Reproductive: Grossly normal uterus.  No adnexal mass.

Other: No pneumoperitoneum, ascites or focal fluid collection.

Musculoskeletal: No aggressive appearing focal osseous lesions.
Marked lumbar spondylosis.
IMPRESSION: 1. Colo-colonic intussusception at the level of the hepatic flexure
of the colon with suggestion of irregular annular colonic wall
thickening at the lead point, worrisome for colonic malignancy.
Multidisciplinary GI consultation suggested.
2. No evidence of bowel obstruction or perforation. Mild left
colonic diverticulosis.
3. No evidence of metastatic disease in the abdomen or pelvis.
4. Moderate hiatal hernia.
5. Vaguely nodular and bandlike subpleural focus of consolidation in
the peripheral basilar left lower lobe, new, indeterminate, favor
benign nodular scarring/atelectasis. Recommend attention on
follow-up chest CT in 3 months.
6. Aortic Atherosclerosis ([id number]).
